# Patient Record
Sex: Male | Born: 1955 | Race: White | Hispanic: No | Marital: Married | Smoking: Never smoker
Health system: Southern US, Community
[De-identification: ages and names within clinical notes are randomized; demographics above are authoritative.]

## PROBLEM LIST (undated history)

## (undated) DIAGNOSIS — K635 Polyp of colon: Secondary | ICD-10-CM

## (undated) DIAGNOSIS — I10 Essential (primary) hypertension: Secondary | ICD-10-CM

## (undated) DIAGNOSIS — E785 Hyperlipidemia, unspecified: Secondary | ICD-10-CM

## (undated) DIAGNOSIS — K5790 Diverticulosis of intestine, part unspecified, without perforation or abscess without bleeding: Secondary | ICD-10-CM

## (undated) DIAGNOSIS — K219 Gastro-esophageal reflux disease without esophagitis: Secondary | ICD-10-CM

## (undated) HISTORY — DX: Polyp of colon: K63.5

## (undated) HISTORY — DX: Essential (primary) hypertension: I10

## (undated) HISTORY — DX: Diverticulosis of intestine, part unspecified, without perforation or abscess without bleeding: K57.90

## (undated) HISTORY — DX: Gastro-esophageal reflux disease without esophagitis: K21.9

## (undated) HISTORY — DX: Hyperlipidemia, unspecified: E78.5

---

## 2005-10-31 ENCOUNTER — Ambulatory Visit: Payer: Self-pay | Admitting: Family Medicine

## 2005-11-07 ENCOUNTER — Ambulatory Visit: Payer: Self-pay | Admitting: Family Medicine

## 2006-11-05 ENCOUNTER — Ambulatory Visit: Payer: Self-pay | Admitting: Family Medicine

## 2006-11-05 LAB — CONVERTED CEMR LAB
ALT: 34 units/L (ref 0–40)
AST: 25 units/L (ref 0–37)
Albumin: 4.2 g/dL (ref 3.5–5.2)
Alkaline Phosphatase: 52 units/L (ref 39–117)
BUN: 11 mg/dL (ref 6–23)
Basophils Absolute: 0 10*3/uL (ref 0.0–0.1)
Basophils Relative: 0.9 % (ref 0.0–1.0)
CO2: 30 meq/L (ref 19–32)
Calcium: 9.4 mg/dL (ref 8.4–10.5)
Chloride: 103 meq/L (ref 96–112)
Cholesterol: 201 mg/dL (ref 0–200)
Creatinine, Ser: 0.9 mg/dL (ref 0.4–1.5)
Direct LDL: 139.6 mg/dL
Eosinophils Absolute: 0.2 10*3/uL (ref 0.0–0.6)
Eosinophils Relative: 3.5 % (ref 0.0–5.0)
GFR calc Af Amer: 115 mL/min
GFR calc non Af Amer: 95 mL/min
Glucose, Bld: 96 mg/dL (ref 70–99)
HCT: 48 % (ref 39.0–52.0)
HDL: 40.2 mg/dL (ref >39.0)
Hemoglobin: 16.5 g/dL (ref 13.0–17.0)
Lymphocytes Relative: 28.6 % (ref 12.0–46.0)
MCHC: 34.5 g/dL (ref 30.0–36.0)
MCV: 89.5 fL (ref 78.0–100.0)
Monocytes Absolute: 0.4 10*3/uL (ref 0.2–0.7)
Monocytes Relative: 7.9 % (ref 3.0–11.0)
Neutro Abs: 3.3 10*3/uL (ref 1.4–7.7)
Neutrophils Relative %: 59.1 % (ref 43.0–77.0)
PSA: 0.81 ng/mL (ref 0.10–4.00)
Platelets: 295 10*3/uL (ref 150–400)
Potassium: 3.9 meq/L (ref 3.5–5.1)
RBC: 5.36 M/uL (ref 4.22–5.81)
RDW: 12.1 % (ref 11.5–14.6)
Sodium: 139 meq/L (ref 135–145)
TSH: 1.78 microintl units/mL (ref 0.35–5.50)
Total Bilirubin: 0.6 mg/dL (ref 0.3–1.2)
Total CHOL/HDL Ratio: 5
Total Protein: 7.4 g/dL (ref 6.0–8.3)
Triglycerides: 194 mg/dL — ABNORMAL HIGH (ref 0–149)
VLDL: 39 mg/dL (ref 0–40)
WBC: 5.5 10*3/uL (ref 4.5–10.5)

## 2006-11-16 ENCOUNTER — Ambulatory Visit: Payer: Self-pay | Admitting: Family Medicine

## 2006-12-06 ENCOUNTER — Ambulatory Visit: Payer: Self-pay | Admitting: Gastroenterology

## 2007-01-09 ENCOUNTER — Encounter (INDEPENDENT_AMBULATORY_CARE_PROVIDER_SITE_OTHER): Payer: Self-pay | Admitting: *Deleted

## 2007-01-09 ENCOUNTER — Ambulatory Visit: Payer: Self-pay | Admitting: Gastroenterology

## 2007-01-09 LAB — HM COLONOSCOPY

## 2007-05-03 ENCOUNTER — Telehealth: Payer: Self-pay | Admitting: *Deleted

## 2007-05-16 ENCOUNTER — Ambulatory Visit: Payer: Self-pay | Admitting: Family Medicine

## 2007-05-17 LAB — CONVERTED CEMR LAB
ALT: 28 units/L (ref 0–53)
AST: 22 units/L (ref 0–37)
Albumin: 4 g/dL (ref 3.5–5.2)
Alkaline Phosphatase: 46 units/L (ref 39–117)
Bilirubin, Direct: 0.1 mg/dL (ref 0.0–0.3)
Cholesterol: 158 mg/dL (ref 0–200)
HDL: 35 mg/dL — ABNORMAL LOW (ref >39.0)
LDL Cholesterol: 94 mg/dL (ref 0–99)
Total Bilirubin: 0.8 mg/dL (ref 0.3–1.2)
Total CHOL/HDL Ratio: 4.5
Total Protein: 6.4 g/dL (ref 6.0–8.3)
Triglycerides: 146 mg/dL (ref 0–149)
VLDL: 29 mg/dL (ref 0–40)

## 2007-11-18 ENCOUNTER — Ambulatory Visit: Payer: Self-pay | Admitting: Family Medicine

## 2007-11-18 LAB — CONVERTED CEMR LAB
Bilirubin Urine: NEGATIVE
Blood in Urine, dipstick: NEGATIVE
Glucose, Urine, Semiquant: NEGATIVE
Ketones, urine, test strip: NEGATIVE
Nitrite: NEGATIVE
Protein, U semiquant: NEGATIVE
Specific Gravity, Urine: 1.025
Urobilinogen, UA: 0.2
WBC Urine, dipstick: NEGATIVE
pH: 7

## 2007-11-20 LAB — CONVERTED CEMR LAB
ALT: 32 units/L (ref 0–53)
AST: 23 units/L (ref 0–37)
Albumin: 4.3 g/dL (ref 3.5–5.2)
Alkaline Phosphatase: 46 units/L (ref 39–117)
BUN: 11 mg/dL (ref 6–23)
Basophils Absolute: 0 10*3/uL (ref 0.0–0.1)
Basophils Relative: 0.5 % (ref 0.0–1.0)
Bilirubin, Direct: 0.2 mg/dL (ref 0.0–0.3)
CO2: 30 meq/L (ref 19–32)
Calcium: 9.5 mg/dL (ref 8.4–10.5)
Chloride: 103 meq/L (ref 96–112)
Cholesterol: 163 mg/dL (ref 0–200)
Creatinine, Ser: 1 mg/dL (ref 0.4–1.5)
Eosinophils Absolute: 0.1 10*3/uL (ref 0.0–0.6)
Eosinophils Relative: 2 % (ref 0.0–5.0)
GFR calc Af Amer: 102 mL/min
GFR calc non Af Amer: 84 mL/min
Glucose, Bld: 97 mg/dL (ref 70–99)
HCT: 48.1 % (ref 39.0–52.0)
HDL: 35.4 mg/dL — ABNORMAL LOW (ref >39.0)
Hemoglobin: 15.9 g/dL (ref 13.0–17.0)
LDL Cholesterol: 96 mg/dL (ref 0–99)
Lymphocytes Relative: 24.4 % (ref 12.0–46.0)
MCHC: 33 g/dL (ref 30.0–36.0)
MCV: 90.8 fL (ref 78.0–100.0)
Monocytes Absolute: 0.5 10*3/uL (ref 0.2–0.7)
Monocytes Relative: 7.5 % (ref 3.0–11.0)
Neutro Abs: 4.2 10*3/uL (ref 1.4–7.7)
Neutrophils Relative %: 65.6 % (ref 43.0–77.0)
PSA: 0.83 ng/mL (ref 0.10–4.00)
Platelets: 267 10*3/uL (ref 150–400)
Potassium: 5 meq/L (ref 3.5–5.1)
RBC: 5.3 M/uL (ref 4.22–5.81)
RDW: 12.1 % (ref 11.5–14.6)
Sodium: 140 meq/L (ref 135–145)
TSH: 1.24 microintl units/mL (ref 0.35–5.50)
Total Bilirubin: 0.9 mg/dL (ref 0.3–1.2)
Total CHOL/HDL Ratio: 4.6
Total Protein: 6.8 g/dL (ref 6.0–8.3)
Triglycerides: 157 mg/dL — ABNORMAL HIGH (ref 0–149)
VLDL: 31 mg/dL (ref 0–40)
WBC: 6.3 10*3/uL (ref 4.5–10.5)

## 2007-11-22 ENCOUNTER — Ambulatory Visit: Payer: Self-pay | Admitting: Family Medicine

## 2007-11-22 DIAGNOSIS — J45909 Unspecified asthma, uncomplicated: Secondary | ICD-10-CM | POA: Insufficient documentation

## 2007-11-22 DIAGNOSIS — Z860100 Personal history of colon polyps, unspecified: Secondary | ICD-10-CM | POA: Insufficient documentation

## 2007-11-22 DIAGNOSIS — K5730 Diverticulosis of large intestine without perforation or abscess without bleeding: Secondary | ICD-10-CM | POA: Insufficient documentation

## 2007-11-22 DIAGNOSIS — E785 Hyperlipidemia, unspecified: Secondary | ICD-10-CM | POA: Insufficient documentation

## 2007-11-22 DIAGNOSIS — I10 Essential (primary) hypertension: Secondary | ICD-10-CM | POA: Insufficient documentation

## 2007-11-22 DIAGNOSIS — K219 Gastro-esophageal reflux disease without esophagitis: Secondary | ICD-10-CM | POA: Insufficient documentation

## 2008-11-27 ENCOUNTER — Ambulatory Visit: Payer: Self-pay | Admitting: Family Medicine

## 2008-11-27 LAB — CONVERTED CEMR LAB
Bilirubin Urine: NEGATIVE
Glucose, Urine, Semiquant: NEGATIVE
Ketones, urine, test strip: NEGATIVE
Nitrite: NEGATIVE
Specific Gravity, Urine: 1.02
Urobilinogen, UA: 0.2
WBC Urine, dipstick: NEGATIVE
pH: 6.5

## 2008-12-01 LAB — CONVERTED CEMR LAB
ALT: 37 units/L (ref 0–53)
AST: 29 units/L (ref 0–37)
Albumin: 4.4 g/dL (ref 3.5–5.2)
Alkaline Phosphatase: 55 units/L (ref 39–117)
BUN: 13 mg/dL (ref 6–23)
Basophils Absolute: 0 10*3/uL (ref 0.0–0.1)
Basophils Relative: 0.3 % (ref 0.0–3.0)
Bilirubin, Direct: 0.1 mg/dL (ref 0.0–0.3)
CO2: 28 meq/L (ref 19–32)
Calcium: 9.4 mg/dL (ref 8.4–10.5)
Chloride: 101 meq/L (ref 96–112)
Cholesterol: 167 mg/dL (ref 0–200)
Creatinine, Ser: 0.9 mg/dL (ref 0.4–1.5)
Eosinophils Absolute: 0.2 10*3/uL (ref 0.0–0.7)
Eosinophils Relative: 3.3 % (ref 0.0–5.0)
GFR calc Af Amer: 114 mL/min
GFR calc non Af Amer: 95 mL/min
Glucose, Bld: 93 mg/dL (ref 70–99)
HCT: 48.6 % (ref 39.0–52.0)
HDL: 41.8 mg/dL (ref >39.0)
Hemoglobin: 16.9 g/dL (ref 13.0–17.0)
LDL Cholesterol: 87 mg/dL (ref 0–99)
Lymphocytes Relative: 26.2 % (ref 12.0–46.0)
MCHC: 34.8 g/dL (ref 30.0–36.0)
MCV: 89.7 fL (ref 78.0–100.0)
Monocytes Absolute: 0.5 10*3/uL (ref 0.1–1.0)
Monocytes Relative: 7.9 % (ref 3.0–12.0)
Neutro Abs: 3.9 10*3/uL (ref 1.4–7.7)
Neutrophils Relative %: 62.3 % (ref 43.0–77.0)
PSA: 0.71 ng/mL (ref 0.10–4.00)
Platelets: 268 10*3/uL (ref 150–400)
Potassium: 3.9 meq/L (ref 3.5–5.1)
RBC: 5.42 M/uL (ref 4.22–5.81)
RDW: 12.4 % (ref 11.5–14.6)
Sodium: 140 meq/L (ref 135–145)
TSH: 1.34 microintl units/mL (ref 0.35–5.50)
Total Bilirubin: 1 mg/dL (ref 0.3–1.2)
Total CHOL/HDL Ratio: 4
Total Protein: 7.3 g/dL (ref 6.0–8.3)
Triglycerides: 192 mg/dL — ABNORMAL HIGH (ref 0–149)
VLDL: 38 mg/dL (ref 0–40)
WBC: 6.2 10*3/uL (ref 4.5–10.5)

## 2008-12-04 ENCOUNTER — Ambulatory Visit: Payer: Self-pay | Admitting: Family Medicine

## 2008-12-04 DIAGNOSIS — F528 Other sexual dysfunction not due to a substance or known physiological condition: Secondary | ICD-10-CM | POA: Insufficient documentation

## 2008-12-31 ENCOUNTER — Encounter: Payer: Self-pay | Admitting: Family Medicine

## 2009-01-11 ENCOUNTER — Telehealth: Payer: Self-pay | Admitting: Family Medicine

## 2009-01-12 ENCOUNTER — Telehealth: Payer: Self-pay | Admitting: Family Medicine

## 2009-01-18 ENCOUNTER — Ambulatory Visit: Payer: Self-pay | Admitting: Family Medicine

## 2009-01-18 DIAGNOSIS — J209 Acute bronchitis, unspecified: Secondary | ICD-10-CM

## 2009-12-23 ENCOUNTER — Ambulatory Visit: Payer: Self-pay | Admitting: Family Medicine

## 2009-12-23 LAB — CONVERTED CEMR LAB
Bilirubin Urine: NEGATIVE
Blood in Urine, dipstick: NEGATIVE
Glucose, Urine, Semiquant: NEGATIVE
Ketones, urine, test strip: NEGATIVE
Nitrite: NEGATIVE
Protein, U semiquant: NEGATIVE
Specific Gravity, Urine: 1.02
Urobilinogen, UA: 0.2
WBC Urine, dipstick: NEGATIVE
pH: 6

## 2009-12-24 LAB — CONVERTED CEMR LAB
ALT: 36 units/L (ref 0–53)
AST: 28 units/L (ref 0–37)
Albumin: 4.4 g/dL (ref 3.5–5.2)
Alkaline Phosphatase: 52 units/L (ref 39–117)
BUN: 12 mg/dL (ref 6–23)
Basophils Absolute: 0 10*3/uL (ref 0.0–0.1)
Basophils Relative: 0.5 % (ref 0.0–3.0)
Bilirubin, Direct: 0 mg/dL (ref 0.0–0.3)
CO2: 31 meq/L (ref 19–32)
Calcium: 9.5 mg/dL (ref 8.4–10.5)
Chloride: 102 meq/L (ref 96–112)
Cholesterol: 157 mg/dL (ref 0–200)
Creatinine, Ser: 1 mg/dL (ref 0.4–1.5)
Eosinophils Absolute: 0.2 10*3/uL (ref 0.0–0.7)
Eosinophils Relative: 3.1 % (ref 0.0–5.0)
GFR calc non Af Amer: 83.33 mL/min (ref >60)
Glucose, Bld: 91 mg/dL (ref 70–99)
HCT: 47.1 % (ref 39.0–52.0)
HDL: 50.3 mg/dL (ref >39.00)
Hemoglobin: 16 g/dL (ref 13.0–17.0)
LDL Cholesterol: 73 mg/dL (ref 0–99)
Lymphocytes Relative: 24.6 % (ref 12.0–46.0)
Lymphs Abs: 1.5 10*3/uL (ref 0.7–4.0)
MCHC: 34 g/dL (ref 30.0–36.0)
MCV: 90.7 fL (ref 78.0–100.0)
Monocytes Absolute: 0.5 10*3/uL (ref 0.1–1.0)
Monocytes Relative: 8.7 % (ref 3.0–12.0)
Neutro Abs: 3.7 10*3/uL (ref 1.4–7.7)
Neutrophils Relative %: 63.1 % (ref 43.0–77.0)
PSA: 0.85 ng/mL (ref 0.10–4.00)
Platelets: 245 10*3/uL (ref 150.0–400.0)
Potassium: 4.2 meq/L (ref 3.5–5.1)
RBC: 5.2 M/uL (ref 4.22–5.81)
RDW: 12.5 % (ref 11.5–14.6)
Sodium: 143 meq/L (ref 135–145)
TSH: 1.63 microintl units/mL (ref 0.35–5.50)
Total Bilirubin: 0.9 mg/dL (ref 0.3–1.2)
Total CHOL/HDL Ratio: 3
Total Protein: 7.8 g/dL (ref 6.0–8.3)
Triglycerides: 170 mg/dL — ABNORMAL HIGH (ref 0.0–149.0)
VLDL: 34 mg/dL (ref 0.0–40.0)
WBC: 5.9 10*3/uL (ref 4.5–10.5)

## 2009-12-29 ENCOUNTER — Ambulatory Visit: Payer: Self-pay | Admitting: Family Medicine

## 2010-10-09 HISTORY — PX: RETINAL DETACHMENT REPAIR W/ SCLERAL BUCKLE LE: SHX2338

## 2010-10-19 ENCOUNTER — Telehealth: Payer: Self-pay | Admitting: Family Medicine

## 2010-11-08 NOTE — Assessment & Plan Note (Signed)
Summary: CPX // RS   Vital Signs:  Patient profile:   55 year old Grant Height:      Marcus.5 inches Weight:      220 pounds BP sitting:   140 / 86  (left arm) Cuff size:   regular  Vitals Entered By: Raechel Ache, RN (December 29, 2009 1:55 PM) CC: CPX, labs done.   History of Present Illness: 55 yr old Grant for cpx. He feels fine and has no concerns.   Allergies (verified): No Known Drug Allergies  Past History:  Past Medical History: Reviewed history from 11/22/2007 and no changes required. Colonic polyps, hx of GERD Diverticulosis, colon Asthma Hypertension Hyperlipidemia  Past Surgical History: Reviewed history from 11/22/2007 and no changes required. Colonoscopy 01/09/07 per Dr. Russella Dar, repeat in 5 yrs  Family History: Reviewed history from 12/04/2008 and no changes required. Family History of CAD Grant 1st degree relative <60 Family History of Colon polyps mother has Alzheimers  Social History: Reviewed history from 11/22/2007 and no changes required. Married Never Smoked Alcohol use-yes  Review of Systems  The patient denies anorexia, fever, weight loss, weight gain, vision loss, decreased hearing, hoarseness, chest pain, syncope, dyspnea on exertion, peripheral edema, prolonged cough, headaches, hemoptysis, abdominal pain, melena, hematochezia, severe indigestion/heartburn, hematuria, incontinence, genital sores, muscle weakness, suspicious skin lesions, transient blindness, difficulty walking, depression, unusual weight change, abnormal bleeding, enlarged lymph nodes, angioedema, breast masses, and testicular masses.    Physical Exam  General:  Well-developed,well-nourished,in no acute distress; alert,appropriate and cooperative throughout examination Head:  Normocephalic and atraumatic without obvious abnormalities. No apparent alopecia or balding. Eyes:  No corneal or conjunctival inflammation noted. EOMI. Perrla. Funduscopic exam benign, without  hemorrhages, exudates or papilledema. Vision grossly normal. Ears:  External ear exam shows no significant lesions or deformities.  Otoscopic examination reveals clear canals, tympanic membranes are intact bilaterally without bulging, retraction, inflammation or discharge. Hearing is grossly normal bilaterally. Nose:  External nasal examination shows no deformity or inflammation. Nasal mucosa are pink and moist without lesions or exudates. Mouth:  Oral mucosa and oropharynx without lesions or exudates.  Teeth in good repair. Neck:  No deformities, masses, or tenderness noted. Chest Wall:  No deformities, masses, tenderness or gynecomastia noted. Lungs:  Normal respiratory effort, chest expands symmetrically. Lungs are clear to auscultation, no crackles or wheezes. Heart:  Normal rate and regular rhythm. S1 and S2 normal without gallop, murmur, click, rub or other extra sounds. EKG normal Abdomen:  Bowel sounds positive,abdomen soft and non-tender without masses, organomegaly or hernias noted. Rectal:  No external abnormalities noted. Normal sphincter tone. No rectal masses or tenderness. Heme neg.  Genitalia:  Testes bilaterally descended without nodularity, tenderness or masses. No scrotal masses or lesions. No penis lesions or urethral discharge. Prostate:  Prostate gland firm and smooth, no enlargement, nodularity, tenderness, mass, asymmetry or induration. Msk:  No deformity or scoliosis noted of thoracic or lumbar spine.   Pulses:  R and L carotid,radial,femoral,dorsalis pedis and posterior tibial pulses are full and equal bilaterally Extremities:  No clubbing, cyanosis, edema, or deformity noted with normal full range of motion of all joints.   Neurologic:  No cranial nerve deficits noted. Station and gait are normal. Plantar reflexes are down-going bilaterally. DTRs are symmetrical throughout. Sensory, motor and coordinative functions appear intact. Skin:  Intact without suspicious lesions or  rashes Cervical Nodes:  No lymphadenopathy noted Axillary Nodes:  No palpable lymphadenopathy Inguinal Nodes:  No significant adenopathy Psych:  Cognition and  judgment appear intact. Alert and cooperative with normal attention span and concentration. No apparent delusions, illusions, hallucinations   Impression & Recommendations:  Problem # 1:  WELL ADULT EXAM (ICD-V70.0)  Orders: EKG w/ Interpretation (93000) Hemoccult Guaiac-1 spec.(in office) (82270)  Complete Medication List: 1)  Fish Oil Oil (Fish oil) .Marland Kitchen.. 1 every other day 2)  Aspirin 81 Mg Tbec (Aspirin) .... One by mouth every day 3)  Simvastatin 20 Mg Tabs (Simvastatin) .... Once daily 4)  Lipo-flavonoid Plus Tabs (Vitamins-lipotropics) .... Once daily 5)  Hydrocodone-homatropine 5-1.5 Mg/45ml Syrp (Hydrocodone-homatropine) .Marland Kitchen.. 1 tsp q 4 hours as needed cough 6)  Ventolin Hfa 108 (90 Base) Mcg/act Aers (Albuterol sulfate) .... 2 puffs q 4 hours as needed 7)  Prevacid 24hr 15 Mg Cpdr (Lansoprazole) .Marland Kitchen.. 1 once daily 8)  Zyrtec Allergy 10 Mg Tabs (Cetirizine hcl) .... As needed  Patient Instructions: 1)  Please schedule a follow-up appointment in 6 months .  2)  It is important that you exercise reguarly at least 20 minutes 5 times a week. If you develop chest pain, have severe difficulty breathing, or feel very tired, stop exercising immediately and seek medical attention.  Prescriptions: VENTOLIN HFA 108 (90 BASE) MCG/ACT AERS (ALBUTEROL SULFATE) 2 puffs q 4 hours as needed  #2 x 3   Entered and Authorized by:   Nelwyn Salisbury MD   Signed by:   Nelwyn Salisbury MD on 12/29/2009   Method used:   Print then Give to Patient   RxID:   [id number] SIMVASTATIN 20 MG  TABS (SIMVASTATIN) once daily  #90 x 3   Entered and Authorized by:   Nelwyn Salisbury MD   Signed by:   Nelwyn Salisbury MD on 12/29/2009   Method used:   Print then Give to Patient   RxID:   [id number]

## 2010-11-10 NOTE — Progress Notes (Signed)
Summary: [facility]   Phone Note Call from Patient Call back at Work Phone [phone]   Caller: Patient Call For: Nelwyn Salisbury MD Summary of Call: Needs to talk to [facility] re: his [facility] prescriptions. [phone] Needs a 14 day supply of Simvastatin [facility]) Initial call taken by: Lynann Beaver CMA AAMA,  October 19, 2010 3:30 PM  Follow-up for Phone Call        called pt left mess   per march notes from dr fry pt needs 6 month fcllow up appt Follow-up by: Pura Spice, RN,  October 19, 2010 3:48 PM  Additional Follow-up for Phone Call Additional follow up Details #1::        pt returned call  needs 14 days simivastatin  informed him of above appt  Additional Follow-up by: Pura Spice, RN,  October 19, 2010 4:46 PM    New/Updated Medications: SIMVASTATIN 20 MG  TABS (SIMVASTATIN) once daily Prescriptions: SIMVASTATIN 20 MG  TABS (SIMVASTATIN) once daily  #30 x 0   Entered by:   Pura Spice, RN   Authorized by:   Nelwyn Salisbury MD   Signed by:   Pura Spice, RN on 10/19/2010   Method used:   [facility]ronically to        [phone][facility] (retail)       [address] [place], [place]  [address]       Ph: [phone]       Fax: [phone]   RxID:   [id number]

## 2011-01-12 ENCOUNTER — Telehealth: Payer: Self-pay | Admitting: *Deleted

## 2011-01-12 MED ORDER — SCOPOLAMINE 1 MG/3DAYS TD PT72: MEDICATED_PATCH | TRANSDERMAL | Status: DC

## 2011-01-12 NOTE — Telephone Encounter (Signed)
Call in Scopolamine patches, to apply one patch behind an ear every 3 days, #10 with no rf

## 2011-01-12 NOTE — Telephone Encounter (Signed)
rx for scopolamine patches called [facility]  Pt aware.

## 2011-01-12 NOTE — Telephone Encounter (Signed)
Pt would like scopalamine patches for a cruise .  Has labs scheduled prior to cruise.  [facility] [place]. Please contact patient with answer.

## 2011-02-09 ENCOUNTER — Other Ambulatory Visit (INDEPENDENT_AMBULATORY_CARE_PROVIDER_SITE_OTHER): Payer: 59 | Admitting: Family Medicine

## 2011-02-09 DIAGNOSIS — Z0000 Encounter for general adult medical examination without abnormal findings: Secondary | ICD-10-CM

## 2011-02-09 LAB — POCT URINALYSIS DIPSTICK
Bilirubin, UA: NEGATIVE
Blood, UA: NEGATIVE
Glucose, UA: NEGATIVE
Ketones, UA: NEGATIVE
Leukocytes, UA: NEGATIVE
Nitrite, UA: NEGATIVE
Protein, UA: NEGATIVE
Spec Grav, UA: 1.02
Urobilinogen, UA: 0.2
pH, UA: 7

## 2011-02-09 LAB — CBC WITH DIFFERENTIAL/PLATELET
Basophils Absolute: 0 10*3/uL (ref 0.0–0.1)
Basophils Relative: 0.3 % (ref 0.0–3.0)
Eosinophils Absolute: 0.3 10*3/uL (ref 0.0–0.7)
Eosinophils Relative: 5.5 % — ABNORMAL HIGH (ref 0.0–5.0)
HCT: 47.3 % (ref 39.0–52.0)
Hemoglobin: 16.1 g/dL (ref 13.0–17.0)
Lymphocytes Relative: 33.5 % (ref 12.0–46.0)
Lymphs Abs: 1.9 10*3/uL (ref 0.7–4.0)
MCHC: 34 g/dL (ref 30.0–36.0)
MCV: 90.8 fl (ref 78.0–100.0)
Monocytes Absolute: 0.5 10*3/uL (ref 0.1–1.0)
Monocytes Relative: 9 % (ref 3.0–12.0)
Neutro Abs: 2.9 10*3/uL (ref 1.4–7.7)
Neutrophils Relative %: 51.7 % (ref 43.0–77.0)
Platelets: 269 10*3/uL (ref 150.0–400.0)
RBC: 5.21 Mil/uL (ref 4.22–5.81)
RDW: 13.7 % (ref 11.5–14.6)
WBC: 5.5 10*3/uL (ref 4.5–10.5)

## 2011-02-09 LAB — PSA: PSA: 0.85 ng/mL (ref 0.10–4.00)

## 2011-02-09 LAB — HEPATIC FUNCTION PANEL
ALT: 35 U/L (ref 0–53)
AST: 27 U/L (ref 0–37)
Albumin: 4.4 g/dL (ref 3.5–5.2)
Alkaline Phosphatase: 51 U/L (ref 39–117)
Bilirubin, Direct: 0.1 mg/dL (ref 0.0–0.3)
Total Bilirubin: 1 mg/dL (ref 0.3–1.2)
Total Protein: 7.3 g/dL (ref 6.0–8.3)

## 2011-02-09 LAB — BASIC METABOLIC PANEL
BUN: 15 mg/dL (ref 6–23)
CO2: 28 mEq/L (ref 19–32)
Calcium: 9.7 mg/dL (ref 8.4–10.5)
Chloride: 103 mEq/L (ref 96–112)
Creatinine, Ser: 1 mg/dL (ref 0.4–1.5)
GFR: 84.92 mL/min (ref >60.00)
Glucose, Bld: 94 mg/dL (ref 70–99)
Potassium: 5.3 mEq/L — ABNORMAL HIGH (ref 3.5–5.1)
Sodium: 141 mEq/L (ref 135–145)

## 2011-02-09 LAB — LIPID PANEL
Cholesterol: 172 mg/dL (ref 0–200)
HDL: 44.3 mg/dL (ref >39.00)
LDL Cholesterol: 97 mg/dL (ref 0–99)
Total CHOL/HDL Ratio: 4
Triglycerides: 156 mg/dL — ABNORMAL HIGH (ref 0.0–149.0)
VLDL: 31.2 mg/dL (ref 0.0–40.0)

## 2011-02-09 LAB — TSH: TSH: 1.68 u[IU]/mL (ref 0.35–5.50)

## 2011-02-13 ENCOUNTER — Telehealth: Payer: Self-pay | Admitting: *Deleted

## 2011-02-13 NOTE — Telephone Encounter (Signed)
LM w/ wife.

## 2011-02-24 NOTE — Assessment & Plan Note (Signed)
[facility] OFFICE NOTE   NAME:Brosnahan, SERJIO DEUPREE                  MRN:          [id number]  DATE:11/16/2006                            DOB:          [birth year]    This is a 55 year old gentleman here for a complete physical  examination. Generally feels good and has no complaints. His asthma has  been doing quite well, and he has not used an albuterol inhaler in over  a year. His acid reflux is controlled well with diet and over-the-  counter agents. We have been following him for elevated lipids in the  past, but have thus far tried to avoid medications. It sounds like he is  watching a fairly healthy diet when I talked to him today. He is due for  another colonoscopy this year since he has had a history of benign  polyps.   For further details of his Past Medical History, Family History, Social  History, habits, etc., see last physical note dated November 07, 2005.   ALLERGIES:  None.   CURRENT MEDICATIONS:  1. Prilosec OTC once daily.  2. Multivitamins daily.  3. Aspirin 81 mg daily.   OBJECTIVE:  VITAL SIGNS:  Height 5 feet 8 inches, weight 203, BP 108/70,  pulse 68 and regular.  GENERAL:  Appears to be healthy.  SKIN:  Clear.  HEENT:  Eyes:  Clear. Oropharynx:  Clear.  NECK:  Supple without lymphadenopathy, masses.  LUNGS:  Clear.  CARDIAC:  Rate and rhythm regular without gallops, murmurs or rubs.  Pulses are full.  ABDOMEN:  Soft, normal bowel sounds, nontender masses.  GENITALIA:  Normal male.  RECTAL:  No masses or tenderness, prostate is within normal limits.  Stool Hemoccult negative.  EXTREMITIES:  No clubbing, cyanosis or edema.  NEUROLOGIC:  Grossly intact.   He was here for fasting labs on January 28. These were all within normal  limits except for his lipid panel. Triglycerides were high at 194. HDL  was adequate at 40, and LDL was high at 140.   ASSESSMENT/PLAN:  1. Complete  physical exam. I encouraged him to get more regular      exercise.  2. History of colon polyps. Will set him up to get another colonoscopy      this year.  3. Hyperlipidemia. Will begin treatment with simvastatin 20 mg once      daily. Along with his diet I      asked him to come back for another lipid panel in three months.  4. Asthma doing well off of all medications.  5. Gastroesophageal reflux disease, stable.     Tera Mater. Clent Ridges, MD  Electronically Signed    SAF/MedQ  DD: 11/19/2006  DT: 11/19/2006  Job #: [id number]

## 2011-03-07 ENCOUNTER — Telehealth: Payer: Self-pay | Admitting: *Deleted

## 2011-03-07 NOTE — Telephone Encounter (Signed)
Pt unable to keep cpx appt scheduled for tomorrow due to staffing issues at his job.  Requesting to be worked in for cpx with the next 2 wks, preferable in the afternoon.  Please advise if and where pt can be worked in for cpx.

## 2011-03-08 ENCOUNTER — Encounter: Payer: Self-pay | Admitting: Family Medicine

## 2011-03-08 NOTE — Telephone Encounter (Signed)
Okay to work him in on any afternoon except Friday

## 2011-03-09 NOTE — Telephone Encounter (Signed)
appt scheduled with patient.

## 2011-03-11 ENCOUNTER — Ambulatory Visit: Payer: Self-pay | Admitting: Ophthalmology

## 2011-03-12 NOTE — Progress Notes (Signed)
  Subjective:    Patient ID: Marcus Grant, male    DOB: [birth year], 55 y.o.   MRN: [id number]  HPI    Review of Systems     Objective:   Physical Exam        Assessment & Plan:   This encounter was created in error - please disregard.

## 2011-03-24 ENCOUNTER — Other Ambulatory Visit: Payer: Self-pay | Admitting: Family Medicine

## 2011-03-29 ENCOUNTER — Ambulatory Visit (INDEPENDENT_AMBULATORY_CARE_PROVIDER_SITE_OTHER): Payer: 59 | Admitting: Family Medicine

## 2011-03-29 ENCOUNTER — Encounter: Payer: Self-pay | Admitting: Family Medicine

## 2011-03-29 VITALS — BP 132/84 | HR 68 | Temp 98.6°F | Ht 69.0 in | Wt 216.0 lb

## 2011-03-29 DIAGNOSIS — Z0000 Encounter for general adult medical examination without abnormal findings: Secondary | ICD-10-CM

## 2011-03-29 MED ORDER — ALBUTEROL SULFATE HFA 108 (90 BASE) MCG/ACT IN AERS: 2.0000 | INHALATION_SPRAY | Freq: Four times a day (QID) | RESPIRATORY_TRACT | Status: DC | PRN

## 2011-03-29 MED ORDER — SIMVASTATIN 20 MG PO TABS: 20.0000 mg | ORAL_TABLET | Freq: Every day | ORAL | Status: DC

## 2011-03-29 NOTE — Progress Notes (Signed)
  Subjective:    Patient ID: Marcus Grant, male    DOB: 01-27-1956, 55 y.o.   MRN: [id number]  HPI 55 yr old male for a cpx. He feels well with no complaints. He had retinal surgery 2 months ago and his vision is improving, but he has some trouble in the left eye.    Review of Systems  Constitutional: Negative.   HENT: Negative.   Eyes: Negative.   Respiratory: Negative.   Cardiovascular: Negative.   Gastrointestinal: Negative.   Genitourinary: Negative.   Musculoskeletal: Negative.   Skin: Negative.   Neurological: Negative.   Hematological: Negative.   Psychiatric/Behavioral: Negative.        Objective:   Physical Exam  Constitutional: He is oriented to person, place, and time. He appears well-developed and well-nourished. No distress.  HENT:  Head: Normocephalic and atraumatic.  Right Ear: External ear normal.  Left Ear: External ear normal.  Nose: Nose normal.  Mouth/Throat: Oropharynx is clear and moist. No oropharyngeal exudate.  Eyes: Conjunctivae and EOM are normal. Pupils are equal, round, and reactive to light. Right eye exhibits no discharge. Left eye exhibits no discharge. No scleral icterus.  Neck: Neck supple. No JVD present. No tracheal deviation present. No thyromegaly present.  Cardiovascular: Normal rate, regular rhythm, normal heart sounds and intact distal pulses.  Exam reveals no gallop and no friction rub.   No murmur heard.      EKG normal   Pulmonary/Chest: Effort normal and breath sounds normal. No respiratory distress. He has no wheezes. He has no rales. He exhibits no tenderness.  Abdominal: Soft. Bowel sounds are normal. He exhibits no distension and no mass. There is no tenderness. There is no rebound and no guarding.  Genitourinary: Rectum normal, prostate normal and penis normal. Guaiac negative stool. No penile tenderness.  Musculoskeletal: Normal range of motion. He exhibits no edema and no tenderness.  Lymphadenopathy:    He has no  cervical adenopathy.  Neurological: He is alert and oriented to person, place, and time. He has normal reflexes. No cranial nerve deficit. He exhibits normal muscle tone. Coordination normal.  Skin: Skin is warm and dry. No rash noted. He is not diaphoretic. No erythema. No pallor.  Psychiatric: He has a normal mood and affect. His behavior is normal. Judgment and thought content normal.          Assessment & Plan:  Doing well. He needs to exercise more

## 2012-05-01 ENCOUNTER — Other Ambulatory Visit: Payer: Self-pay | Admitting: Family Medicine

## 2012-06-12 ENCOUNTER — Other Ambulatory Visit (INDEPENDENT_AMBULATORY_CARE_PROVIDER_SITE_OTHER): Payer: 59

## 2012-06-12 DIAGNOSIS — Z0000 Encounter for general adult medical examination without abnormal findings: Secondary | ICD-10-CM

## 2012-06-12 LAB — HEPATIC FUNCTION PANEL
ALT: 30 U/L (ref 0–53)
AST: 24 U/L (ref 0–37)
Albumin: 4.3 g/dL (ref 3.5–5.2)
Alkaline Phosphatase: 50 U/L (ref 39–117)
Bilirubin, Direct: 0.2 mg/dL (ref 0.0–0.3)
Total Bilirubin: 0.8 mg/dL (ref 0.3–1.2)
Total Protein: 7.1 g/dL (ref 6.0–8.3)

## 2012-06-12 LAB — POCT URINALYSIS DIPSTICK
Bilirubin, UA: NEGATIVE
Blood, UA: NEGATIVE
Glucose, UA: NEGATIVE
Ketones, UA: NEGATIVE
Leukocytes, UA: NEGATIVE
Nitrite, UA: NEGATIVE
Protein, UA: NEGATIVE
Spec Grav, UA: 1.02
Urobilinogen, UA: 0.2
pH, UA: 7

## 2012-06-12 LAB — CBC WITH DIFFERENTIAL/PLATELET
Basophils Absolute: 0 10*3/uL (ref 0.0–0.1)
Basophils Relative: 0.3 % (ref 0.0–3.0)
Eosinophils Absolute: 0.2 10*3/uL (ref 0.0–0.7)
Eosinophils Relative: 3.4 % (ref 0.0–5.0)
HCT: 47.3 % (ref 39.0–52.0)
Hemoglobin: 15.7 g/dL (ref 13.0–17.0)
Lymphocytes Relative: 31.5 % (ref 12.0–46.0)
Lymphs Abs: 1.8 10*3/uL (ref 0.7–4.0)
MCHC: 33.2 g/dL (ref 30.0–36.0)
MCV: 91.4 fl (ref 78.0–100.0)
Monocytes Absolute: 0.5 10*3/uL (ref 0.1–1.0)
Monocytes Relative: 9.4 % (ref 3.0–12.0)
Neutro Abs: 3.2 10*3/uL (ref 1.4–7.7)
Neutrophils Relative %: 55.4 % (ref 43.0–77.0)
Platelets: 258 10*3/uL (ref 150.0–400.0)
RBC: 5.17 Mil/uL (ref 4.22–5.81)
RDW: 13.5 % (ref 11.5–14.6)
WBC: 5.8 10*3/uL (ref 4.5–10.5)

## 2012-06-12 LAB — BASIC METABOLIC PANEL
BUN: 15 mg/dL (ref 6–23)
CO2: 26 mEq/L (ref 19–32)
Calcium: 9.5 mg/dL (ref 8.4–10.5)
Chloride: 101 mEq/L (ref 96–112)
Creatinine, Ser: 1 mg/dL (ref 0.4–1.5)
GFR: 85.5 mL/min (ref >60.00)
Glucose, Bld: 89 mg/dL (ref 70–99)
Potassium: 4.7 mEq/L (ref 3.5–5.1)
Sodium: 135 mEq/L (ref 135–145)

## 2012-06-12 LAB — LIPID PANEL
Cholesterol: 169 mg/dL (ref 0–200)
HDL: 44.3 mg/dL (ref >39.00)
LDL Cholesterol: 94 mg/dL (ref 0–99)
Total CHOL/HDL Ratio: 4
Triglycerides: 153 mg/dL — ABNORMAL HIGH (ref 0.0–149.0)
VLDL: 30.6 mg/dL (ref 0.0–40.0)

## 2012-06-12 LAB — PSA: PSA: 1.21 ng/mL (ref 0.10–4.00)

## 2012-06-12 LAB — TSH: TSH: 1.9 u[IU]/mL (ref 0.35–5.50)

## 2012-06-13 NOTE — Progress Notes (Signed)
Quick Note:  I left voice message with results. ______ 

## 2012-06-21 ENCOUNTER — Encounter: Payer: 59 | Admitting: Family Medicine

## 2012-07-16 ENCOUNTER — Encounter: Payer: Self-pay | Admitting: Family Medicine

## 2012-07-16 ENCOUNTER — Ambulatory Visit (INDEPENDENT_AMBULATORY_CARE_PROVIDER_SITE_OTHER): Payer: 59 | Admitting: Family Medicine

## 2012-07-16 VITALS — BP 132/84 | HR 87 | Temp 98.6°F | Ht 69.0 in | Wt 213.0 lb

## 2012-07-16 DIAGNOSIS — Z0000 Encounter for general adult medical examination without abnormal findings: Secondary | ICD-10-CM

## 2012-07-16 DIAGNOSIS — Z23 Encounter for immunization: Secondary | ICD-10-CM

## 2012-07-16 MED ORDER — TERBINAFINE HCL 250 MG PO TABS: 250.0000 mg | ORAL_TABLET | Freq: Every day | ORAL | Status: DC

## 2012-07-16 MED ORDER — SIMVASTATIN 20 MG PO TABS: 20.0000 mg | ORAL_TABLET | Freq: Every day | ORAL | Status: DC

## 2012-07-16 MED ORDER — ALBUTEROL SULFATE HFA 108 (90 BASE) MCG/ACT IN AERS: 2.0000 | INHALATION_SPRAY | RESPIRATORY_TRACT | Status: DC | PRN

## 2012-07-16 MED ORDER — LANSOPRAZOLE 30 MG PO CPDR: 30.0000 mg | DELAYED_RELEASE_CAPSULE | Freq: Every day | ORAL | Status: DC

## 2012-07-16 NOTE — Progress Notes (Signed)
  Subjective:    Patient ID: Marcus Grant, male    DOB: 1956-01-22, 56 y.o.   MRN: [id number]  HPI 56 yr old male for a cpx. He feels well.    Review of Systems  Constitutional: Negative.   HENT: Negative.   Eyes: Negative.   Respiratory: Negative.   Cardiovascular: Negative.   Gastrointestinal: Negative.   Genitourinary: Negative.   Musculoskeletal: Negative.   Skin: Negative.   Neurological: Negative.   Hematological: Negative.   Psychiatric/Behavioral: Negative.        Objective:   Physical Exam  Constitutional: He is oriented to person, place, and time. He appears well-developed and well-nourished. No distress.  HENT:  Head: Normocephalic and atraumatic.  Right Ear: External ear normal.  Left Ear: External ear normal.  Nose: Nose normal.  Mouth/Throat: Oropharynx is clear and moist. No oropharyngeal exudate.  Eyes: Conjunctivae normal and EOM are normal. Pupils are equal, round, and reactive to light. Right eye exhibits no discharge. Left eye exhibits no discharge. No scleral icterus.  Neck: Neck supple. No JVD present. No tracheal deviation present. No thyromegaly present.  Cardiovascular: Normal rate, regular rhythm, normal heart sounds and intact distal pulses.  Exam reveals no gallop and no friction rub.   No murmur heard.      EKG normal   Pulmonary/Chest: Effort normal and breath sounds normal. No respiratory distress. He has no wheezes. He has no rales. He exhibits no tenderness.  Abdominal: Soft. Bowel sounds are normal. He exhibits no distension and no mass. There is no tenderness. There is no rebound and no guarding.  Genitourinary: Rectum normal, prostate normal and penis normal. Guaiac negative stool. No penile tenderness.  Musculoskeletal: Normal range of motion. He exhibits no edema and no tenderness.  Lymphadenopathy:    He has no cervical adenopathy.  Neurological: He is alert and oriented to person, place, and time. He has normal reflexes. No  cranial nerve deficit. He exhibits normal muscle tone. Coordination normal.  Skin: Skin is warm and dry. No rash noted. He is not diaphoretic. No erythema. No pallor.  Psychiatric: He has a normal mood and affect. His behavior is normal. Judgment and thought content normal.          Assessment & Plan:  Well exam

## 2012-08-20 ENCOUNTER — Telehealth: Payer: Self-pay | Admitting: Family Medicine

## 2012-08-20 NOTE — Telephone Encounter (Signed)
Pt would like attachment that goes on the end of albuterol inhaler. Best number for him [phone]

## 2012-08-21 NOTE — Telephone Encounter (Signed)
Per Dr. Clent Ridges, okay to call in aero chamber spacer with 3 refills. [facility]

## 2013-05-26 ENCOUNTER — Telehealth: Payer: Self-pay | Admitting: Family Medicine

## 2013-05-26 NOTE — Telephone Encounter (Signed)
Pt insurance is no longer covering his lansoprazole (PREVACID) 30 MG capsule. They have given him 3 different generic medications to chose from; "raprozole", "pantaprazole", and "omeprazole". He would like a 3 month supply of whichever one Dr. Clent Ridges chooses, call into [facility]. Please assist.

## 2013-05-26 NOTE — Telephone Encounter (Signed)
Send in Omeprazole 40 mg daily, #90 with 3 rf 

## 2013-05-27 MED ORDER — OMEPRAZOLE 40 MG PO CPDR: 40.0000 mg | DELAYED_RELEASE_CAPSULE | Freq: Every day | ORAL | Status: DC

## 2013-05-27 NOTE — Telephone Encounter (Signed)
New Rx sent.

## 2013-07-10 ENCOUNTER — Other Ambulatory Visit (INDEPENDENT_AMBULATORY_CARE_PROVIDER_SITE_OTHER): Payer: 59

## 2013-07-10 DIAGNOSIS — Z0000 Encounter for general adult medical examination without abnormal findings: Secondary | ICD-10-CM

## 2013-07-10 LAB — POCT URINALYSIS DIPSTICK
Bilirubin, UA: NEGATIVE
Glucose, UA: NEGATIVE
Ketones, UA: NEGATIVE
Leukocytes, UA: NEGATIVE
Nitrite, UA: NEGATIVE
Protein, UA: NEGATIVE
Spec Grav, UA: 1.02
Urobilinogen, UA: 0.2
pH, UA: 7

## 2013-07-10 LAB — CBC WITH DIFFERENTIAL/PLATELET
Basophils Absolute: 0 10*3/uL (ref 0.0–0.1)
Basophils Relative: 0.4 % (ref 0.0–3.0)
Eosinophils Absolute: 0.3 10*3/uL (ref 0.0–0.7)
Eosinophils Relative: 4.6 % (ref 0.0–5.0)
HCT: 47.4 % (ref 39.0–52.0)
Hemoglobin: 16.3 g/dL (ref 13.0–17.0)
Lymphocytes Relative: 29.6 % (ref 12.0–46.0)
Lymphs Abs: 1.8 10*3/uL (ref 0.7–4.0)
MCHC: 34.3 g/dL (ref 30.0–36.0)
MCV: 88.5 fl (ref 78.0–100.0)
Monocytes Absolute: 0.5 10*3/uL (ref 0.1–1.0)
Monocytes Relative: 8.8 % (ref 3.0–12.0)
Neutro Abs: 3.4 10*3/uL (ref 1.4–7.7)
Neutrophils Relative %: 56.6 % (ref 43.0–77.0)
Platelets: 276 10*3/uL (ref 150.0–400.0)
RBC: 5.35 Mil/uL (ref 4.22–5.81)
RDW: 13.2 % (ref 11.5–14.6)
WBC: 6.1 10*3/uL (ref 4.5–10.5)

## 2013-07-11 LAB — HEPATIC FUNCTION PANEL
ALT: 25 U/L (ref 0–53)
AST: 21 U/L (ref 0–37)
Albumin: 4.4 g/dL (ref 3.5–5.2)
Alkaline Phosphatase: 51 U/L (ref 39–117)
Bilirubin, Direct: 0.1 mg/dL (ref 0.0–0.3)
Total Bilirubin: 0.9 mg/dL (ref 0.3–1.2)
Total Protein: 7 g/dL (ref 6.0–8.3)

## 2013-07-11 LAB — BASIC METABOLIC PANEL
BUN: 12 mg/dL (ref 6–23)
CO2: 29 mEq/L (ref 19–32)
Calcium: 9.6 mg/dL (ref 8.4–10.5)
Chloride: 103 mEq/L (ref 96–112)
Creatinine, Ser: 0.9 mg/dL (ref 0.4–1.5)
GFR: 96.56 mL/min (ref >60.00)
Glucose, Bld: 88 mg/dL (ref 70–99)
Potassium: 4.7 mEq/L (ref 3.5–5.1)
Sodium: 139 mEq/L (ref 135–145)

## 2013-07-11 LAB — LIPID PANEL
Cholesterol: 160 mg/dL (ref 0–200)
HDL: 42.6 mg/dL (ref >39.00)
LDL Cholesterol: 83 mg/dL (ref 0–99)
Total CHOL/HDL Ratio: 4
Triglycerides: 174 mg/dL — ABNORMAL HIGH (ref 0.0–149.0)
VLDL: 34.8 mg/dL (ref 0.0–40.0)

## 2013-07-11 LAB — PSA: PSA: 1.01 ng/mL (ref 0.10–4.00)

## 2013-07-11 LAB — TSH: TSH: 1.33 u[IU]/mL (ref 0.35–5.50)

## 2013-07-11 NOTE — Progress Notes (Signed)
Quick Note:  Pt has appointment on 07/17/13 will go over then. ______ 

## 2013-07-17 ENCOUNTER — Encounter: Payer: Self-pay | Admitting: Family Medicine

## 2013-07-17 ENCOUNTER — Ambulatory Visit (INDEPENDENT_AMBULATORY_CARE_PROVIDER_SITE_OTHER): Payer: 59 | Admitting: Family Medicine

## 2013-07-17 VITALS — BP 130/80 | HR 96 | Temp 98.4°F | Ht 69.0 in | Wt 210.0 lb

## 2013-07-17 DIAGNOSIS — Z0000 Encounter for general adult medical examination without abnormal findings: Secondary | ICD-10-CM

## 2013-07-17 MED ORDER — OMEPRAZOLE 40 MG PO CPDR: 40.0000 mg | DELAYED_RELEASE_CAPSULE | Freq: Every day | ORAL | Status: DC

## 2013-07-17 MED ORDER — SIMVASTATIN 20 MG PO TABS: 20.0000 mg | ORAL_TABLET | Freq: Every day | ORAL | Status: DC

## 2013-07-17 MED ORDER — ALBUTEROL SULFATE HFA 108 (90 BASE) MCG/ACT IN AERS: 2.0000 | INHALATION_SPRAY | RESPIRATORY_TRACT | Status: DC | PRN

## 2013-07-17 NOTE — Progress Notes (Signed)
  Subjective:    Patient ID: Marcus Grant, male    DOB: 02/07/1956, 57 y.o.   MRN: [id number]  HPI 57 yr old male for a cpx. He feels fine.    Review of Systems  Constitutional: Negative.   HENT: Negative.   Eyes: Negative.   Respiratory: Negative.   Cardiovascular: Negative.   Gastrointestinal: Negative.   Genitourinary: Negative.   Musculoskeletal: Negative.   Skin: Negative.   Neurological: Negative.   Psychiatric/Behavioral: Negative.        Objective:   Physical Exam  Constitutional: He is oriented to person, place, and time. He appears well-developed and well-nourished. No distress.  HENT:  Head: Normocephalic and atraumatic.  Right Ear: External ear normal.  Left Ear: External ear normal.  Nose: Nose normal.  Mouth/Throat: Oropharynx is clear and moist. No oropharyngeal exudate.  Eyes: Conjunctivae and EOM are normal. Pupils are equal, round, and reactive to light. Right eye exhibits no discharge. Left eye exhibits no discharge. No scleral icterus.  Neck: Neck supple. No JVD present. No tracheal deviation present. No thyromegaly present.  Cardiovascular: Normal rate, regular rhythm, normal heart sounds and intact distal pulses.  Exam reveals no gallop and no friction rub.   No murmur heard. EKG normal   Pulmonary/Chest: Effort normal and breath sounds normal. No respiratory distress. He has no wheezes. He has no rales. He exhibits no tenderness.  Abdominal: Soft. Bowel sounds are normal. He exhibits no distension and no mass. There is no tenderness. There is no rebound and no guarding.  Genitourinary: Rectum normal, prostate normal and penis normal. Guaiac negative stool. No penile tenderness.  Musculoskeletal: Normal range of motion. He exhibits no edema and no tenderness.  Lymphadenopathy:    He has no cervical adenopathy.  Neurological: He is alert and oriented to person, place, and time. He has normal reflexes. No cranial nerve deficit. He exhibits normal  muscle tone. Coordination normal.  Skin: Skin is warm and dry. No rash noted. He is not diaphoretic. No erythema. No pallor.  Psychiatric: He has a normal mood and affect. His behavior is normal. Judgment and thought content normal.          Assessment & Plan:  Well exam.

## 2014-01-27 ENCOUNTER — Telehealth: Payer: Self-pay | Admitting: Family Medicine

## 2014-01-27 NOTE — Telephone Encounter (Signed)
Switch to Pantoprazole 40 mg daily, call in #90 with 3 rf

## 2014-01-27 NOTE — Telephone Encounter (Signed)
Pt states his insurance will not cover omeprazole (PRILOSEC) 40 MG capsule  but will now cover pantoprazole. Pt would like 90 day w/ [facility] [address] Pt needs new rx due to new med. Ok to leave message

## 2014-01-28 MED ORDER — PANTOPRAZOLE SODIUM 40 MG PO TBEC: 40.0000 mg | DELAYED_RELEASE_TABLET | Freq: Every day | ORAL | Status: DC

## 2014-01-28 NOTE — Telephone Encounter (Signed)
I sent script e-scribe and left a voice message for pt. 

## 2014-03-23 ENCOUNTER — Telehealth: Payer: Self-pay | Admitting: Family Medicine

## 2014-03-23 NOTE — Telephone Encounter (Signed)
Pt no longer getting rx from mail order need new rx sent to [place]

## 2014-03-24 NOTE — Telephone Encounter (Signed)
Medicine is simvastatin (ZOCOR) 20 MG tablet

## 2014-03-24 NOTE — Telephone Encounter (Signed)
What medication is needed?

## 2014-03-30 MED ORDER — SIMVASTATIN 20 MG PO TABS: 20.0000 mg | ORAL_TABLET | Freq: Every day | ORAL | Status: DC

## 2014-03-30 NOTE — Telephone Encounter (Signed)
I sent script e-scribe and left message for pt.

## 2014-06-12 ENCOUNTER — Other Ambulatory Visit: Payer: Self-pay | Admitting: Family Medicine

## 2014-07-23 ENCOUNTER — Other Ambulatory Visit (INDEPENDENT_AMBULATORY_CARE_PROVIDER_SITE_OTHER): Payer: 59

## 2014-07-23 DIAGNOSIS — Z0000 Encounter for general adult medical examination without abnormal findings: Secondary | ICD-10-CM

## 2014-07-23 LAB — HEPATIC FUNCTION PANEL
ALT: 30 U/L (ref 0–53)
AST: 22 U/L (ref 0–37)
Albumin: 3.9 g/dL (ref 3.5–5.2)
Alkaline Phosphatase: 55 U/L (ref 39–117)
Bilirubin, Direct: 0.2 mg/dL (ref 0.0–0.3)
Total Bilirubin: 1.1 mg/dL (ref 0.2–1.2)
Total Protein: 7.5 g/dL (ref 6.0–8.3)

## 2014-07-23 LAB — CBC WITH DIFFERENTIAL/PLATELET
Basophils Absolute: 0 10*3/uL (ref 0.0–0.1)
Basophils Relative: 0.6 % (ref 0.0–3.0)
Eosinophils Absolute: 0.3 10*3/uL (ref 0.0–0.7)
Eosinophils Relative: 4.4 % (ref 0.0–5.0)
HCT: 49.1 % (ref 39.0–52.0)
Hemoglobin: 16 g/dL (ref 13.0–17.0)
Lymphocytes Relative: 28.2 % (ref 12.0–46.0)
Lymphs Abs: 1.8 10*3/uL (ref 0.7–4.0)
MCHC: 32.7 g/dL (ref 30.0–36.0)
MCV: 90.6 fl (ref 78.0–100.0)
Monocytes Absolute: 0.5 10*3/uL (ref 0.1–1.0)
Monocytes Relative: 8 % (ref 3.0–12.0)
Neutro Abs: 3.8 10*3/uL (ref 1.4–7.7)
Neutrophils Relative %: 58.8 % (ref 43.0–77.0)
Platelets: 280 10*3/uL (ref 150.0–400.0)
RBC: 5.42 Mil/uL (ref 4.22–5.81)
RDW: 13.4 % (ref 11.5–15.5)
WBC: 6.4 10*3/uL (ref 4.0–10.5)

## 2014-07-23 LAB — LIPID PANEL
Cholesterol: 151 mg/dL (ref 0–200)
HDL: 36.9 mg/dL — ABNORMAL LOW (ref >39.00)
LDL Cholesterol: 87 mg/dL (ref 0–99)
NonHDL: 114.1
Total CHOL/HDL Ratio: 4
Triglycerides: 138 mg/dL (ref 0.0–149.0)
VLDL: 27.6 mg/dL (ref 0.0–40.0)

## 2014-07-23 LAB — BASIC METABOLIC PANEL
BUN: 14 mg/dL (ref 6–23)
CO2: 27 mEq/L (ref 19–32)
Calcium: 9.6 mg/dL (ref 8.4–10.5)
Chloride: 103 mEq/L (ref 96–112)
Creatinine, Ser: 0.9 mg/dL (ref 0.4–1.5)
GFR: 92.51 mL/min (ref >60.00)
Glucose, Bld: 92 mg/dL (ref 70–99)
Potassium: 4.4 mEq/L (ref 3.5–5.1)
Sodium: 139 mEq/L (ref 135–145)

## 2014-07-23 LAB — POCT URINALYSIS DIPSTICK
Bilirubin, UA: NEGATIVE
Blood, UA: NEGATIVE
Glucose, UA: NEGATIVE
Ketones, UA: NEGATIVE
Leukocytes, UA: NEGATIVE
Nitrite, UA: NEGATIVE
Protein, UA: NEGATIVE
Spec Grav, UA: 1.015
Urobilinogen, UA: 0.2
pH, UA: 7

## 2014-07-23 LAB — TSH: TSH: 1.5 u[IU]/mL (ref 0.35–4.50)

## 2014-07-23 LAB — PSA: PSA: 1.06 ng/mL (ref 0.10–4.00)

## 2014-07-29 ENCOUNTER — Ambulatory Visit (INDEPENDENT_AMBULATORY_CARE_PROVIDER_SITE_OTHER): Payer: 59 | Admitting: Family Medicine

## 2014-07-29 ENCOUNTER — Encounter: Payer: Self-pay | Admitting: Family Medicine

## 2014-07-29 VITALS — BP 163/90 | HR 107 | Temp 98.9°F | Ht 68.5 in | Wt 213.0 lb

## 2014-07-29 DIAGNOSIS — Z0000 Encounter for general adult medical examination without abnormal findings: Secondary | ICD-10-CM

## 2014-07-29 MED ORDER — PANTOPRAZOLE SODIUM 40 MG PO TBEC: 40.0000 mg | DELAYED_RELEASE_TABLET | Freq: Every day | ORAL | Status: DC

## 2014-07-29 MED ORDER — LISINOPRIL-HYDROCHLOROTHIAZIDE 10-12.5 MG PO TABS: 1.0000 | ORAL_TABLET | Freq: Every day | ORAL | Status: DC

## 2014-07-29 MED ORDER — SIMVASTATIN 20 MG PO TABS: ORAL_TABLET | ORAL | Status: DC

## 2014-07-29 MED ORDER — TERBINAFINE HCL 250 MG PO TABS: 250.0000 mg | ORAL_TABLET | Freq: Every day | ORAL | Status: DC

## 2014-07-29 NOTE — Progress Notes (Signed)
Pre visit review using our clinic review tool, if applicable. No additional management support is needed unless otherwise documented below in the visit note. 

## 2014-07-29 NOTE — Progress Notes (Signed)
   Subjective:    Patient ID: Marcus Grant, male    DOB: December 28, 1955, 58 y.o.   MRN: [id number]  HPI 58 yr old male for a cpx. He feels well. He is eating a healthy diet but doe snot get much exercise.    Review of Systems  Constitutional: Negative.   HENT: Negative.   Eyes: Negative.   Respiratory: Negative.   Cardiovascular: Negative.   Gastrointestinal: Negative.   Genitourinary: Negative.   Musculoskeletal: Negative.   Skin: Negative.   Neurological: Negative.   Psychiatric/Behavioral: Negative.        Objective:   Physical Exam  Constitutional: He is oriented to person, place, and time. He appears well-developed and well-nourished. No distress.  HENT:  Head: Normocephalic and atraumatic.  Right Ear: External ear normal.  Left Ear: External ear normal.  Nose: Nose normal.  Mouth/Throat: Oropharynx is clear and moist. No oropharyngeal exudate.  Eyes: Conjunctivae and EOM are normal. Pupils are equal, round, and reactive to light. Right eye exhibits no discharge. Left eye exhibits no discharge. No scleral icterus.  Neck: Neck supple. No JVD present. No tracheal deviation present. No thyromegaly present.  Cardiovascular: Normal rate, regular rhythm, normal heart sounds and intact distal pulses.  Exam reveals no gallop and no friction rub.   No murmur heard. EKG normal   Pulmonary/Chest: Effort normal and breath sounds normal. No respiratory distress. He has no wheezes. He has no rales. He exhibits no tenderness.  Abdominal: Soft. Bowel sounds are normal. He exhibits no distension and no mass. There is no tenderness. There is no rebound and no guarding.  Genitourinary: Rectum normal, prostate normal and penis normal. Guaiac negative stool. No penile tenderness.  Musculoskeletal: Normal range of motion. He exhibits no edema and no tenderness.  Lymphadenopathy:    He has no cervical adenopathy.  Neurological: He is alert and oriented to person, place, and time. He has  normal reflexes. No cranial nerve deficit. He exhibits normal muscle tone. Coordination normal.  Skin: Skin is warm and dry. No rash noted. He is not diaphoretic. No erythema. No pallor.  Psychiatric: He has a normal mood and affect. His behavior is normal. Judgment and thought content normal.          Assessment & Plan:  Well exam. Start on Lisinopril HCT daily. He will monitor the BP at home daily and report to [place] in one month

## 2015-07-26 ENCOUNTER — Other Ambulatory Visit (INDEPENDENT_AMBULATORY_CARE_PROVIDER_SITE_OTHER): Payer: 59

## 2015-07-26 DIAGNOSIS — Z0000 Encounter for general adult medical examination without abnormal findings: Secondary | ICD-10-CM | POA: Diagnosis not present

## 2015-07-26 LAB — CBC WITH DIFFERENTIAL/PLATELET
Basophils Absolute: 0 10*3/uL (ref 0.0–0.1)
Basophils Relative: 0.4 % (ref 0.0–3.0)
Eosinophils Absolute: 0.1 10*3/uL (ref 0.0–0.7)
Eosinophils Relative: 2.5 % (ref 0.0–5.0)
HCT: 47.5 % (ref 39.0–52.0)
Hemoglobin: 16.1 g/dL (ref 13.0–17.0)
Lymphocytes Relative: 35.2 % (ref 12.0–46.0)
Lymphs Abs: 1.9 10*3/uL (ref 0.7–4.0)
MCHC: 33.8 g/dL (ref 30.0–36.0)
MCV: 89.3 fl (ref 78.0–100.0)
Monocytes Absolute: 0.7 10*3/uL (ref 0.1–1.0)
Monocytes Relative: 13.6 % — ABNORMAL HIGH (ref 3.0–12.0)
Neutro Abs: 2.6 10*3/uL (ref 1.4–7.7)
Neutrophils Relative %: 48.3 % (ref 43.0–77.0)
Platelets: 253 10*3/uL (ref 150.0–400.0)
RBC: 5.32 Mil/uL (ref 4.22–5.81)
RDW: 13.6 % (ref 11.5–15.5)
WBC: 5.4 10*3/uL (ref 4.0–10.5)

## 2015-07-26 LAB — LIPID PANEL
Cholesterol: 117 mg/dL (ref 0–200)
HDL: 36.2 mg/dL — ABNORMAL LOW (ref >39.00)
LDL Cholesterol: 63 mg/dL (ref 0–99)
NonHDL: 80.85
Total CHOL/HDL Ratio: 3
Triglycerides: 90 mg/dL (ref 0.0–149.0)
VLDL: 18 mg/dL (ref 0.0–40.0)

## 2015-07-26 LAB — POCT URINALYSIS DIPSTICK
Bilirubin, UA: NEGATIVE
Glucose, UA: NEGATIVE
Ketones, UA: NEGATIVE
Leukocytes, UA: NEGATIVE
Nitrite, UA: NEGATIVE
Spec Grav, UA: 1.025
Urobilinogen, UA: 1
pH, UA: 6

## 2015-07-26 LAB — HEPATIC FUNCTION PANEL
ALT: 32 U/L (ref 0–53)
AST: 22 U/L (ref 0–37)
Albumin: 4.2 g/dL (ref 3.5–5.2)
Alkaline Phosphatase: 67 U/L (ref 39–117)
Bilirubin, Direct: 0.1 mg/dL (ref 0.0–0.3)
Total Bilirubin: 0.4 mg/dL (ref 0.2–1.2)
Total Protein: 6.7 g/dL (ref 6.0–8.3)

## 2015-07-26 LAB — BASIC METABOLIC PANEL
BUN: 16 mg/dL (ref 6–23)
CO2: 32 mEq/L (ref 19–32)
Calcium: 9.2 mg/dL (ref 8.4–10.5)
Chloride: 103 mEq/L (ref 96–112)
Creatinine, Ser: 0.87 mg/dL (ref 0.40–1.50)
GFR: 95.86 mL/min (ref >60.00)
Glucose, Bld: 102 mg/dL — ABNORMAL HIGH (ref 70–99)
Potassium: 4.4 mEq/L (ref 3.5–5.1)
Sodium: 143 mEq/L (ref 135–145)

## 2015-07-26 LAB — PSA: PSA: 0.73 ng/mL (ref 0.10–4.00)

## 2015-07-26 LAB — TSH: TSH: 1.53 u[IU]/mL (ref 0.35–4.50)

## 2015-07-28 ENCOUNTER — Other Ambulatory Visit: Payer: Self-pay | Admitting: Family Medicine

## 2015-08-02 ENCOUNTER — Ambulatory Visit (INDEPENDENT_AMBULATORY_CARE_PROVIDER_SITE_OTHER): Payer: 59 | Admitting: Family Medicine

## 2015-08-02 ENCOUNTER — Encounter: Payer: Self-pay | Admitting: Family Medicine

## 2015-08-02 VITALS — BP 129/78 | HR 101 | Temp 98.1°F | Ht 68.5 in | Wt 209.0 lb

## 2015-08-02 DIAGNOSIS — Z0000 Encounter for general adult medical examination without abnormal findings: Secondary | ICD-10-CM

## 2015-08-02 MED ORDER — PANTOPRAZOLE SODIUM 40 MG PO TBEC: 40.0000 mg | DELAYED_RELEASE_TABLET | Freq: Every day | ORAL | Status: DC

## 2015-08-02 MED ORDER — MONTELUKAST SODIUM 10 MG PO TABS: 10.0000 mg | ORAL_TABLET | Freq: Every day | ORAL | Status: DC

## 2015-08-02 NOTE — Progress Notes (Signed)
Pre visit review using our clinic review tool, if applicable. No additional management support is needed unless otherwise documented below in the visit note. 

## 2015-08-02 NOTE — Progress Notes (Signed)
   Subjective:    Patient ID: Marcus Grant, male    DOB: November 19, 1955, 59 y.o.   MRN: [id number]  HPI 59 yr old male for a cpx. He feels well but he asks to try a new allergy medication. He has used antihistamines like Zyrtec in the past but finds them to be sedating. He wants to try Singulair instead.    Review of Systems  Constitutional: Negative.   HENT: Negative.   Eyes: Negative.   Respiratory: Negative.   Cardiovascular: Negative.   Gastrointestinal: Negative.   Genitourinary: Negative.   Musculoskeletal: Negative.   Skin: Negative.   Neurological: Negative.   Psychiatric/Behavioral: Negative.        Objective:   Physical Exam  Constitutional: He is oriented to person, place, and time. He appears well-developed and well-nourished. No distress.  HENT:  Head: Normocephalic and atraumatic.  Right Ear: External ear normal.  Left Ear: External ear normal.  Nose: Nose normal.  Mouth/Throat: Oropharynx is clear and moist. No oropharyngeal exudate.  Eyes: Conjunctivae and EOM are normal. Pupils are equal, round, and reactive to light. Right eye exhibits no discharge. Left eye exhibits no discharge. No scleral icterus.  Neck: Neck supple. No JVD present. No tracheal deviation present. No thyromegaly present.  Cardiovascular: Normal rate, regular rhythm, normal heart sounds and intact distal pulses.  Exam reveals no gallop and no friction rub.   No murmur heard. EKG normal   Pulmonary/Chest: Effort normal and breath sounds normal. No respiratory distress. He has no wheezes. He has no rales. He exhibits no tenderness.  Abdominal: Soft. Bowel sounds are normal. He exhibits no distension and no mass. There is no tenderness. There is no rebound and no guarding.  Genitourinary: Rectum normal, prostate normal and penis normal. Guaiac negative stool. No penile tenderness.  Musculoskeletal: Normal range of motion. He exhibits no edema or tenderness.  Lymphadenopathy:    He has no  cervical adenopathy.  Neurological: He is alert and oriented to person, place, and time. He has normal reflexes. No cranial nerve deficit. He exhibits normal muscle tone. Coordination normal.  Skin: Skin is warm and dry. No rash noted. He is not diaphoretic. No erythema. No pallor.  Psychiatric: He has a normal mood and affect. His behavior is normal. Judgment and thought content normal.          Assessment & Plan:  Well exam. We discussed diet and exercise advice. He will try Singulair 10 mg daily.

## 2015-08-28 ENCOUNTER — Other Ambulatory Visit: Payer: Self-pay | Admitting: Family Medicine

## 2016-03-27 ENCOUNTER — Telehealth: Payer: Self-pay | Admitting: Family Medicine

## 2016-03-27 NOTE — Telephone Encounter (Signed)
Pt need new Rx for Transderm-Scop 1.5 mg (pt going on cruise at end of month)   Pharm:  [facility]

## 2016-03-27 NOTE — Telephone Encounter (Signed)
Call in one box of 10

## 2016-03-28 MED ORDER — SCOPOLAMINE 1 MG/3DAYS TD PT72: 1.0000 | MEDICATED_PATCH | TRANSDERMAL | Status: DC

## 2016-03-28 NOTE — Telephone Encounter (Signed)
I sent script e-scribe and left a voice message for pt. 

## 2016-07-24 ENCOUNTER — Other Ambulatory Visit: Payer: Self-pay | Admitting: Family Medicine

## 2016-08-18 ENCOUNTER — Other Ambulatory Visit (INDEPENDENT_AMBULATORY_CARE_PROVIDER_SITE_OTHER): Payer: 59

## 2016-08-18 DIAGNOSIS — Z0000 Encounter for general adult medical examination without abnormal findings: Secondary | ICD-10-CM | POA: Diagnosis not present

## 2016-08-18 LAB — POC URINALSYSI DIPSTICK (AUTOMATED)
Bilirubin, UA: NEGATIVE
Blood, UA: NEGATIVE
Glucose, UA: NEGATIVE
Ketones, UA: NEGATIVE
Leukocytes, UA: NEGATIVE
Nitrite, UA: NEGATIVE
Spec Grav, UA: 1.015
Urobilinogen, UA: 0.2
pH, UA: 7

## 2016-08-18 LAB — LIPID PANEL
Cholesterol: 162 mg/dL (ref 0–200)
HDL: 50 mg/dL (ref >39.00)
LDL Cholesterol: 89 mg/dL (ref 0–99)
NonHDL: 111.51
Total CHOL/HDL Ratio: 3
Triglycerides: 114 mg/dL (ref 0.0–149.0)
VLDL: 22.8 mg/dL (ref 0.0–40.0)

## 2016-08-18 LAB — PSA: PSA: 1.68 ng/mL (ref 0.10–4.00)

## 2016-08-18 LAB — CBC WITH DIFFERENTIAL/PLATELET
Basophils Absolute: 0 10*3/uL (ref 0.0–0.1)
Basophils Relative: 0.6 % (ref 0.0–3.0)
Eosinophils Absolute: 0.2 10*3/uL (ref 0.0–0.7)
Eosinophils Relative: 3.3 % (ref 0.0–5.0)
HCT: 46.3 % (ref 39.0–52.0)
Hemoglobin: 16 g/dL (ref 13.0–17.0)
Lymphocytes Relative: 27.1 % (ref 12.0–46.0)
Lymphs Abs: 2 10*3/uL (ref 0.7–4.0)
MCHC: 34.5 g/dL (ref 30.0–36.0)
MCV: 87.8 fl (ref 78.0–100.0)
Monocytes Absolute: 0.6 10*3/uL (ref 0.1–1.0)
Monocytes Relative: 7.8 % (ref 3.0–12.0)
Neutro Abs: 4.4 10*3/uL (ref 1.4–7.7)
Neutrophils Relative %: 61.2 % (ref 43.0–77.0)
Platelets: 280 10*3/uL (ref 150.0–400.0)
RBC: 5.28 Mil/uL (ref 4.22–5.81)
RDW: 13.4 % (ref 11.5–15.5)
WBC: 7.3 10*3/uL (ref 4.0–10.5)

## 2016-08-18 LAB — HEPATIC FUNCTION PANEL
ALT: 23 U/L (ref 0–53)
AST: 16 U/L (ref 0–37)
Albumin: 4.4 g/dL (ref 3.5–5.2)
Alkaline Phosphatase: 56 U/L (ref 39–117)
Bilirubin, Direct: 0.1 mg/dL (ref 0.0–0.3)
Total Bilirubin: 0.6 mg/dL (ref 0.2–1.2)
Total Protein: 6.9 g/dL (ref 6.0–8.3)

## 2016-08-18 LAB — BASIC METABOLIC PANEL
BUN: 14 mg/dL (ref 6–23)
CO2: 30 mEq/L (ref 19–32)
Calcium: 9.5 mg/dL (ref 8.4–10.5)
Chloride: 100 mEq/L (ref 96–112)
Creatinine, Ser: 0.92 mg/dL (ref 0.40–1.50)
GFR: 89.54 mL/min (ref >60.00)
Glucose, Bld: 92 mg/dL (ref 70–99)
Potassium: 4.8 mEq/L (ref 3.5–5.1)
Sodium: 139 mEq/L (ref 135–145)

## 2016-08-18 LAB — TSH: TSH: 1.55 u[IU]/mL (ref 0.35–4.50)

## 2016-08-22 ENCOUNTER — Encounter: Payer: 59 | Admitting: Family Medicine

## 2016-09-04 ENCOUNTER — Encounter: Payer: 59 | Admitting: Family Medicine

## 2016-09-06 ENCOUNTER — Encounter: Payer: Self-pay | Admitting: Family Medicine

## 2016-09-06 ENCOUNTER — Ambulatory Visit (INDEPENDENT_AMBULATORY_CARE_PROVIDER_SITE_OTHER): Payer: 59 | Admitting: Family Medicine

## 2016-09-06 VITALS — BP 144/88 | HR 94 | Temp 98.0°F | Ht 68.5 in | Wt 216.0 lb

## 2016-09-06 DIAGNOSIS — Z0000 Encounter for general adult medical examination without abnormal findings: Secondary | ICD-10-CM | POA: Diagnosis not present

## 2016-09-06 MED ORDER — PANTOPRAZOLE SODIUM 40 MG PO TBEC: 40.0000 mg | DELAYED_RELEASE_TABLET | Freq: Every day | ORAL | 3 refills | Status: DC

## 2016-09-06 NOTE — Progress Notes (Signed)
   Subjective:    Patient ID: Marcus Grant, male    DOB: 25-Aug-1956, 60 y.o.   MRN: [id number]  HPI 60 yr old male for a well exam. He feels fine. His BP at home is stable in the 120s over 70s.    Review of Systems  Constitutional: Negative.   HENT: Negative.   Eyes: Negative.   Respiratory: Negative.   Cardiovascular: Negative.   Gastrointestinal: Negative.   Genitourinary: Negative.   Musculoskeletal: Negative.   Skin: Negative.   Neurological: Negative.   Psychiatric/Behavioral: Negative.        Objective:   Physical Exam  Constitutional: He is oriented to person, place, and time. He appears well-developed and well-nourished. No distress.  HENT:  Head: Normocephalic and atraumatic.  Right Ear: External ear normal.  Left Ear: External ear normal.  Nose: Nose normal.  Mouth/Throat: Oropharynx is clear and moist. No oropharyngeal exudate.  Eyes: Conjunctivae and EOM are normal. Pupils are equal, round, and reactive to light. Right eye exhibits no discharge. Left eye exhibits no discharge. No scleral icterus.  Neck: Neck supple. No JVD present. No tracheal deviation present. No thyromegaly present.  Cardiovascular: Normal rate, regular rhythm, normal heart sounds and intact distal pulses.  Exam reveals no gallop and no friction rub.   No murmur heard. Pulmonary/Chest: Effort normal and breath sounds normal. No respiratory distress. He has no wheezes. He has no rales. He exhibits no tenderness.  Abdominal: Soft. Bowel sounds are normal. He exhibits no distension and no mass. There is no tenderness. There is no rebound and no guarding.  Genitourinary: Rectum normal, prostate normal and penis normal. Rectal exam shows guaiac negative stool. No penile tenderness.  Musculoskeletal: Normal range of motion. He exhibits no edema or tenderness.  Lymphadenopathy:    He has no cervical adenopathy.  Neurological: He is alert and oriented to person, place, and time. He has normal  reflexes. No cranial nerve deficit. He exhibits normal muscle tone. Coordination normal.  Skin: Skin is warm and dry. No rash noted. He is not diaphoretic. No erythema. No pallor.  Psychiatric: He has a normal mood and affect. His behavior is normal. Judgment and thought content normal.          Assessment & Plan:  Well exam. We discussed diet and exercise.  Laurey Morale, MD

## 2016-11-20 ENCOUNTER — Encounter: Payer: Self-pay | Admitting: Gastroenterology

## 2016-12-26 DIAGNOSIS — H2512 Age-related nuclear cataract, left eye: Secondary | ICD-10-CM | POA: Diagnosis not present

## 2016-12-26 DIAGNOSIS — H25012 Cortical age-related cataract, left eye: Secondary | ICD-10-CM | POA: Diagnosis not present

## 2016-12-26 DIAGNOSIS — H31002 Unspecified chorioretinal scars, left eye: Secondary | ICD-10-CM | POA: Diagnosis not present

## 2017-02-08 ENCOUNTER — Encounter: Payer: Self-pay | Admitting: Gastroenterology

## 2017-02-14 ENCOUNTER — Other Ambulatory Visit: Payer: Self-pay | Admitting: Family Medicine

## 2017-04-20 ENCOUNTER — Ambulatory Visit (AMBULATORY_SURGERY_CENTER): Payer: Self-pay

## 2017-04-20 VITALS — Ht 68.5 in | Wt 220.0 lb

## 2017-04-20 DIAGNOSIS — Z1211 Encounter for screening for malignant neoplasm of colon: Secondary | ICD-10-CM

## 2017-04-20 MED ORDER — SUPREP BOWEL PREP KIT 17.5-3.13-1.6 GM/177ML PO SOLN: 1.0000 | Freq: Once | ORAL | 0 refills | Status: AC

## 2017-04-20 NOTE — Progress Notes (Signed)
No allergies to eggs or soy No past problems with anesthesia No diet meds No home oxygen  Declined emmi 

## 2017-04-26 ENCOUNTER — Encounter: Payer: Self-pay | Admitting: Gastroenterology

## 2017-05-04 ENCOUNTER — Ambulatory Visit (AMBULATORY_SURGERY_CENTER): Payer: 59 | Admitting: Gastroenterology

## 2017-05-04 ENCOUNTER — Encounter: Payer: Self-pay | Admitting: Gastroenterology

## 2017-05-04 VITALS — BP 123/83 | HR 72 | Temp 98.6°F | Resp 16 | Ht 68.5 in | Wt 220.0 lb

## 2017-05-04 DIAGNOSIS — Z1211 Encounter for screening for malignant neoplasm of colon: Secondary | ICD-10-CM

## 2017-05-04 DIAGNOSIS — Z1212 Encounter for screening for malignant neoplasm of rectum: Secondary | ICD-10-CM

## 2017-05-04 DIAGNOSIS — K635 Polyp of colon: Secondary | ICD-10-CM

## 2017-05-04 DIAGNOSIS — D122 Benign neoplasm of ascending colon: Secondary | ICD-10-CM | POA: Diagnosis not present

## 2017-05-04 DIAGNOSIS — D123 Benign neoplasm of transverse colon: Secondary | ICD-10-CM | POA: Diagnosis not present

## 2017-05-04 DIAGNOSIS — D125 Benign neoplasm of sigmoid colon: Secondary | ICD-10-CM | POA: Diagnosis not present

## 2017-05-04 HISTORY — PX: COLONOSCOPY: SHX174

## 2017-05-04 MED ORDER — SODIUM CHLORIDE 0.9 % IV SOLN: 500.0000 mL | INTRAVENOUS | Status: AC

## 2017-05-04 NOTE — Patient Instructions (Signed)
Await pathology results  Information on polyps,diverticulosis,& hemorrhoids given to you today   YOU HAD AN ENDOSCOPIC PROCEDURE TODAY AT [facility]:   Refer to the procedure report that was given to you for any specific questions about what was found during the examination.  If the procedure report does not answer your questions, please call your gastroenterologist to clarify.  If you requested that your care partner not be given the details of your procedure findings, then the procedure report has been included in a sealed envelope for you to review at your convenience later.  YOU SHOULD EXPECT: Some feelings of bloating in the abdomen. Passage of more gas than usual.  Walking can help get rid of the air that was put into your GI tract during the procedure and reduce the bloating. If you had a lower endoscopy (such as a colonoscopy or flexible sigmoidoscopy) you may notice spotting of blood in your stool or on the toilet paper. If you underwent a bowel prep for your procedure, you may not have a normal bowel movement for a few days.  Please Note:  You might notice some irritation and congestion in your nose or some drainage.  This is from the oxygen used during your procedure.  There is no need for concern and it should clear up in a day or so.  SYMPTOMS TO REPORT IMMEDIATELY:   Following lower endoscopy (colonoscopy or flexible sigmoidoscopy):  Excessive amounts of blood in the stool  Significant tenderness or worsening of abdominal pains  Swelling of the abdomen that is new, acute  Fever of 100F or higher   Following upper endoscopy (EGD)  Vomiting of blood or coffee ground material  New chest pain or pain under the shoulder blades  Painful or persistently difficult swallowing  New shortness of breath  Fever of 100F or higher  Black, tarry-looking stools  For urgent or emergent issues, a gastroenterologist can be reached at any hour by calling [phone]  [phone].   DIET:  We do recommend a small meal at first, but then you may proceed to your regular diet.  Drink plenty of fluids but you should avoid alcoholic beverages for 24 hours.  ACTIVITY:  You should plan to take it easy for the rest of today and you should NOT DRIVE or use heavy machinery until tomorrow (because of the sedation medicines used during the test).    FOLLOW UP: Our staff will call the number listed on your records the next business day following your procedure to check on you and address any questions or concerns that you may have regarding the information given to you following your procedure. If we do not reach you, we will leave a message.  However, if you are feeling well and you are not experiencing any problems, there is no need to return our call.  We will assume that you have returned to your regular daily activities without incident.  If any biopsies were taken you will be contacted by phone or by letter within the next 1-3 weeks.  Please call us at [phone] if you have not heard about the biopsies in 3 weeks.    SIGNATURES/CONFIDENTIALITY: You and/or your care partner have signed paperwork which will be entered into your electronic medical record.  These signatures attest to the fact that that the information above on your After Visit Summary has been reviewed and is understood.  Full responsibility of the confidentiality of this discharge information lies with you  and/or your care-partner. 

## 2017-05-04 NOTE — Progress Notes (Signed)
Called to room to assist during endoscopic procedure.  Patient ID and intended procedure confirmed with present staff. Received instructions for my participation in the procedure from the performing physician.  

## 2017-05-04 NOTE — Progress Notes (Signed)
No changes in medical or surgical hx since PV 

## 2017-05-04 NOTE — Progress Notes (Signed)
To PACU VSS. Report to [email or web address] 

## 2017-05-04 NOTE — Op Note (Signed)
[facility] Patient Name: Marcus Grant Procedure Date: 05/04/2017 10:34 AM MRN: [id number] Endoscopist: Ladene Artist , MD Age: 61 Referring MD:  Date of Birth: [birth year] Gender: Male Account #: [id number] Procedure:                Colonoscopy Indications:              Screening for colorectal malignant neoplasm Medicines:                Monitored Anesthesia Care Procedure:                Pre-Anesthesia Assessment:                           - Prior to the procedure, a History and Physical                            was performed, and patient medications and                            allergies were reviewed. The patient's tolerance of                            previous anesthesia was also reviewed. The risks                            and benefits of the procedure and the sedation                            options and risks were discussed with the patient.                            All questions were answered, and informed consent                            was obtained. Prior Anticoagulants: The patient has                            taken no previous anticoagulant or antiplatelet                            agents. ASA Grade Assessment: II - A patient with                            mild systemic disease. After reviewing the risks                            and benefits, the patient was deemed in                            satisfactory condition to undergo the procedure.                           After obtaining informed consent, the colonoscope  was passed under direct vision. Throughout the                            procedure, the patient's blood pressure, pulse, and                            oxygen saturations were monitored continuously. The                            Colonoscope was introduced through the anus and                            advanced to the the cecum, identified by                            appendiceal orifice and  ileocecal valve. The                            ileocecal valve, appendiceal orifice, and rectum                            were photographed. The quality of the bowel                            preparation was excellent. The colonoscopy was                            performed without difficulty. The patient tolerated                            the procedure well. Scope In: 10:52:27 AM Scope Out: 11:03:48 AM Scope Withdrawal Time: 0 hours 9 minutes 41 seconds  Total Procedure Duration: 0 hours 11 minutes 21 seconds  Findings:                 The perianal and digital rectal examinations were                            normal.                           Three sessile polyps were found in the sigmoid                            colon, transverse colon and ascending colon. The                            polyps were 5 to 7 mm in size. These polyps were                            removed with a cold snare. Resection and retrieval                            were complete.  Many small-mouthed diverticula were found in the                            left colon. There was no evidence of diverticular                            bleeding.                           Internal hemorrhoids were found during                            retroflexion. The hemorrhoids were small and Grade                            I (internal hemorrhoids that do not prolapse).                           One medium-sized localized angiodysplastic lesion                            without bleeding was found in the cecum.                           The exam was otherwise without abnormality on                            direct and retroflexion views. Complications:            No immediate complications. Estimated blood loss:                            None. Estimated Blood Loss:     Estimated blood loss: none. Impression:               - Three 5 to 7 mm polyps in the sigmoid colon, in                             the transverse colon and in the ascending colon,                            removed with a cold snare. Resected and retrieved.                           - Cecal AVM                           - Mild diverticulosis in the left colon. There was                            no evidence of diverticular bleeding.                           - Internal hemorrhoids.                           -  The examination was otherwise normal on direct                            and retroflexion views. Recommendation:           - Repeat colonoscopy in 5 years for surveillance if                            polyp(s) are precancerous.                           - Patient has a contact number available for                            emergencies. The signs and symptoms of potential                            delayed complications were discussed with the                            patient. Return to normal activities tomorrow.                            Written discharge instructions were provided to the                            patient.                           - Resume previous diet.                           - Continue present medications.                           - Await pathology results. Ladene Artist, MD 05/04/2017 11:11:06 AM This report has been signed electronically.

## 2017-05-07 ENCOUNTER — Telehealth: Payer: Self-pay | Admitting: *Deleted

## 2017-05-07 ENCOUNTER — Telehealth: Payer: Self-pay

## 2017-05-07 NOTE — Telephone Encounter (Signed)
Name identifier, left a  Message.

## 2017-05-07 NOTE — Telephone Encounter (Signed)
  Follow up Call-  Call back number 05/04/2017  Post procedure Call Back phone  # [phone]  Permission to leave phone message Yes  Some recent data might be hidden     Patient questions:  Do you have a fever, pain , or abdominal swelling? No. Pain Score  0 *  Have you tolerated food without any problems? Yes.    Have you been able to return to your normal activities? Yes.    Do you have any questions about your discharge instructions: Diet   No. Medications  No. Follow up visit  No.  Do you have questions or concerns about your Care? No.  Actions: * If pain score is 4 or above: No action needed, pain <4.

## 2017-05-17 ENCOUNTER — Telehealth: Payer: Self-pay | Admitting: Gastroenterology

## 2017-05-17 NOTE — Telephone Encounter (Signed)
The pt has been advised that Dr Fuller Plan is out of the office this week and we will call him as soon as reviewed.

## 2017-05-17 NOTE — Telephone Encounter (Signed)
Left message on machine to call back  

## 2017-05-20 ENCOUNTER — Encounter: Payer: Self-pay | Admitting: Gastroenterology

## 2017-05-20 ENCOUNTER — Other Ambulatory Visit: Payer: Self-pay | Admitting: Family Medicine

## 2017-05-25 NOTE — Telephone Encounter (Signed)
Patient requesting path results. [facility] # [phone]

## 2017-05-28 NOTE — Telephone Encounter (Signed)
Patient notified that letter was sent.  He has the letter and has no questions at this time.

## 2017-06-28 DIAGNOSIS — Z23 Encounter for immunization: Secondary | ICD-10-CM | POA: Diagnosis not present

## 2017-08-11 ENCOUNTER — Other Ambulatory Visit: Payer: Self-pay | Admitting: Family Medicine

## 2017-08-14 ENCOUNTER — Other Ambulatory Visit: Payer: Self-pay | Admitting: Family Medicine

## 2017-08-16 ENCOUNTER — Telehealth: Payer: Self-pay | Admitting: Family Medicine

## 2017-08-16 MED ORDER — MONTELUKAST SODIUM 10 MG PO TABS: 10.0000 mg | ORAL_TABLET | Freq: Every day | ORAL | 0 refills | Status: DC

## 2017-08-16 NOTE — Telephone Encounter (Signed)
I sent script e-scribe to [facility]. 

## 2017-08-16 NOTE — Telephone Encounter (Signed)
° ° ° ° ° °  Pt request refill of the following:  montelukast (SINGULAIR) 10 MG tablet   Phamacy:  [facility]

## 2017-08-22 ENCOUNTER — Telehealth: Payer: Self-pay | Admitting: Family Medicine

## 2017-08-22 NOTE — Telephone Encounter (Signed)
Copied from [place] [id number]. Topic: Inquiry >> Aug 22, 2017 11:30 AM Scherrie Gerlach wrote: Pt would like rx for sea sick patch. Pt going on cruise next week.

## 2017-08-22 NOTE — Telephone Encounter (Signed)
Rx request 

## 2017-08-23 MED ORDER — SCOPOLAMINE 1 MG/3DAYS TD PT72: 1.0000 | MEDICATED_PATCH | TRANSDERMAL | 0 refills | Status: DC

## 2017-08-23 NOTE — Telephone Encounter (Signed)
I sent script e-scribe to [facility] and spoke with pt.  

## 2017-08-23 NOTE — Telephone Encounter (Signed)
Call in transdermal scopolamine patches, apply one every 3 days as needed, #one box with no rf

## 2017-09-10 ENCOUNTER — Other Ambulatory Visit: Payer: Self-pay | Admitting: Family Medicine

## 2017-09-13 ENCOUNTER — Encounter: Payer: Self-pay | Admitting: Family Medicine

## 2017-09-13 ENCOUNTER — Ambulatory Visit (INDEPENDENT_AMBULATORY_CARE_PROVIDER_SITE_OTHER): Payer: 59 | Admitting: Family Medicine

## 2017-09-13 VITALS — BP 130/80 | HR 94 | Temp 98.1°F | Ht 68.5 in | Wt 218.2 lb

## 2017-09-13 DIAGNOSIS — R6882 Decreased libido: Secondary | ICD-10-CM

## 2017-09-13 DIAGNOSIS — Z0000 Encounter for general adult medical examination without abnormal findings: Secondary | ICD-10-CM

## 2017-09-13 LAB — PSA: PSA: 0.96 ng/mL (ref 0.10–4.00)

## 2017-09-13 LAB — CBC WITH DIFFERENTIAL/PLATELET
Basophils Absolute: 0 10*3/uL (ref 0.0–0.1)
Basophils Relative: 0.6 % (ref 0.0–3.0)
Eosinophils Absolute: 0.2 10*3/uL (ref 0.0–0.7)
Eosinophils Relative: 3.4 % (ref 0.0–5.0)
HCT: 48.5 % (ref 39.0–52.0)
Hemoglobin: 16.1 g/dL (ref 13.0–17.0)
Lymphocytes Relative: 23 % (ref 12.0–46.0)
Lymphs Abs: 1.4 10*3/uL (ref 0.7–4.0)
MCHC: 33.2 g/dL (ref 30.0–36.0)
MCV: 90.7 fl (ref 78.0–100.0)
Monocytes Absolute: 0.5 10*3/uL (ref 0.1–1.0)
Monocytes Relative: 7.8 % (ref 3.0–12.0)
Neutro Abs: 4 10*3/uL (ref 1.4–7.7)
Neutrophils Relative %: 65.2 % (ref 43.0–77.0)
Platelets: 300 10*3/uL (ref 150.0–400.0)
RBC: 5.35 Mil/uL (ref 4.22–5.81)
RDW: 13 % (ref 11.5–15.5)
WBC: 6.2 10*3/uL (ref 4.0–10.5)

## 2017-09-13 LAB — LIPID PANEL
Cholesterol: 156 mg/dL (ref 0–200)
HDL: 44.8 mg/dL (ref >39.00)
LDL Cholesterol: 79 mg/dL (ref 0–99)
NonHDL: 110.81
Total CHOL/HDL Ratio: 3
Triglycerides: 160 mg/dL — ABNORMAL HIGH (ref 0.0–149.0)
VLDL: 32 mg/dL (ref 0.0–40.0)

## 2017-09-13 LAB — HEPATIC FUNCTION PANEL
ALT: 25 U/L (ref 0–53)
AST: 17 U/L (ref 0–37)
Albumin: 4.5 g/dL (ref 3.5–5.2)
Alkaline Phosphatase: 59 U/L (ref 39–117)
Bilirubin, Direct: 0.1 mg/dL (ref 0.0–0.3)
Total Bilirubin: 0.7 mg/dL (ref 0.2–1.2)
Total Protein: 6.9 g/dL (ref 6.0–8.3)

## 2017-09-13 LAB — BASIC METABOLIC PANEL
BUN: 16 mg/dL (ref 6–23)
CO2: 26 mEq/L (ref 19–32)
Calcium: 9.4 mg/dL (ref 8.4–10.5)
Chloride: 101 mEq/L (ref 96–112)
Creatinine, Ser: 0.82 mg/dL (ref 0.40–1.50)
GFR: 101.88 mL/min (ref >60.00)
Glucose, Bld: 89 mg/dL (ref 70–99)
Potassium: 4.4 mEq/L (ref 3.5–5.1)
Sodium: 137 mEq/L (ref 135–145)

## 2017-09-13 LAB — TESTOSTERONE: Testosterone: 229.68 ng/dL — ABNORMAL LOW (ref 300.00–890.00)

## 2017-09-13 LAB — TSH: TSH: 1.4 u[IU]/mL (ref 0.35–4.50)

## 2017-09-13 NOTE — Progress Notes (Signed)
   Subjective:    Patient ID: Marcus Grant, male    DOB: Jan 30, 1956, 61 y.o.   MRN: [id number]  HPI Here for a well exam. He feels fine but does ask about some low libido and some erection problems. He tried Viagra once but did not get good results.    Review of Systems  Constitutional: Negative.   HENT: Negative.   Eyes: Negative.   Respiratory: Negative.   Cardiovascular: Negative.   Gastrointestinal: Negative.   Genitourinary: Negative.   Musculoskeletal: Negative.   Skin: Negative.   Neurological: Negative.   Psychiatric/Behavioral: Negative.        Objective:   Physical Exam  Constitutional: He is oriented to person, place, and time. He appears well-developed and well-nourished. No distress.  HENT:  Head: Normocephalic and atraumatic.  Right Ear: External ear normal.  Left Ear: External ear normal.  Nose: Nose normal.  Mouth/Throat: Oropharynx is clear and moist. No oropharyngeal exudate.  Eyes: Conjunctivae and EOM are normal. Pupils are equal, round, and reactive to light. Right eye exhibits no discharge. Left eye exhibits no discharge. No scleral icterus.  Neck: Neck supple. No JVD present. No tracheal deviation present. No thyromegaly present.  Cardiovascular: Normal rate, regular rhythm, normal heart sounds and intact distal pulses. Exam reveals no gallop and no friction rub.  No murmur heard. Pulmonary/Chest: Effort normal and breath sounds normal. No respiratory distress. He has no wheezes. He has no rales. He exhibits no tenderness.  Abdominal: Soft. Bowel sounds are normal. He exhibits no distension and no mass. There is no tenderness. There is no rebound and no guarding.  Genitourinary: Rectum normal, prostate normal and penis normal. Rectal exam shows guaiac negative stool. No penile tenderness.  Musculoskeletal: Normal range of motion. He exhibits no edema or tenderness.  Lymphadenopathy:    He has no cervical adenopathy.  Neurological: He is alert  and oriented to person, place, and time. He has normal reflexes. No cranial nerve deficit. He exhibits normal muscle tone. Coordination normal.  Skin: Skin is warm and dry. No rash noted. He is not diaphoretic. No erythema. No pallor.  Psychiatric: He has a normal mood and affect. His behavior is normal. Judgment and thought content normal.          Assessment & Plan:  Well exam. We discussed diet and exercise. Get fasting labs. We will check a testosterone level as well.  Alysia Penna, MD

## 2017-09-27 ENCOUNTER — Telehealth: Payer: Self-pay

## 2017-09-27 NOTE — Telephone Encounter (Signed)
Pt notified of instructions and verbalized understanding. He will contact insurance and return call.

## 2017-09-27 NOTE — Telephone Encounter (Signed)
We can prescribe a testosterone product for him. Ask him to check with  his insurance company about which ones they will cover and report back to [place]

## 2017-09-27 NOTE — Telephone Encounter (Signed)
Spoke with the pt all of his lab results looked great however, his testosterone level was low and his triglycerides were high. Pt was advised to watch is diet avoid fatty, greases food, oils, butters and to get exercise. Pt wanted to know what he should do about the low Testerone level. Sent to PCP      Copied from [place] [phone]. Topic: Quick Communication - Lab Results >> Sep 26, 2017 12:33 PM Synthia Innocent wrote: Patient would like to speak to someone regarding lab >> Sep 26, 2017 12:52 PM Cox, Melburn Hake, CMA wrote: Labs are not resulted.

## 2017-11-09 ENCOUNTER — Other Ambulatory Visit: Payer: Self-pay | Admitting: Family Medicine

## 2017-11-09 NOTE — Telephone Encounter (Signed)
Last OV 11/03/2016

## 2018-06-19 ENCOUNTER — Other Ambulatory Visit: Payer: Self-pay | Admitting: Family Medicine

## 2018-09-16 ENCOUNTER — Ambulatory Visit (INDEPENDENT_AMBULATORY_CARE_PROVIDER_SITE_OTHER): Payer: 59 | Admitting: Family Medicine

## 2018-09-16 ENCOUNTER — Encounter: Payer: Self-pay | Admitting: Family Medicine

## 2018-09-16 ENCOUNTER — Other Ambulatory Visit: Payer: Self-pay | Admitting: Family Medicine

## 2018-09-16 VITALS — BP 110/70 | Temp 98.1°F | Ht 69.75 in | Wt 223.6 lb

## 2018-09-16 DIAGNOSIS — Z23 Encounter for immunization: Secondary | ICD-10-CM

## 2018-09-16 DIAGNOSIS — Z0000 Encounter for general adult medical examination without abnormal findings: Secondary | ICD-10-CM | POA: Diagnosis not present

## 2018-09-16 DIAGNOSIS — M25571 Pain in right ankle and joints of right foot: Secondary | ICD-10-CM | POA: Diagnosis not present

## 2018-09-16 LAB — LIPID PANEL
Cholesterol: 163 mg/dL (ref 0–200)
HDL: 47.2 mg/dL (ref >39.00)
LDL Cholesterol: 86 mg/dL (ref 0–99)
NonHDL: 115.52
Total CHOL/HDL Ratio: 3
Triglycerides: 150 mg/dL — ABNORMAL HIGH (ref 0.0–149.0)
VLDL: 30 mg/dL (ref 0.0–40.0)

## 2018-09-16 LAB — BASIC METABOLIC PANEL
BUN: 18 mg/dL (ref 6–23)
CO2: 30 mEq/L (ref 19–32)
Calcium: 9.8 mg/dL (ref 8.4–10.5)
Chloride: 101 mEq/L (ref 96–112)
Creatinine, Ser: 0.92 mg/dL (ref 0.40–1.50)
GFR: 88.91 mL/min (ref >60.00)
Glucose, Bld: 104 mg/dL — ABNORMAL HIGH (ref 70–99)
Potassium: 5.1 mEq/L (ref 3.5–5.1)
Sodium: 139 mEq/L (ref 135–145)

## 2018-09-16 LAB — POC URINALSYSI DIPSTICK (AUTOMATED)
Bilirubin, UA: NEGATIVE
Blood, UA: NEGATIVE
Glucose, UA: NEGATIVE
Ketones, UA: NEGATIVE
Leukocytes, UA: NEGATIVE
Nitrite, UA: NEGATIVE
Protein, UA: NEGATIVE
Spec Grav, UA: 1.025 (ref 1.010–1.025)
Urobilinogen, UA: 0.2 E.U./dL
pH, UA: 6 (ref 5.0–8.0)

## 2018-09-16 LAB — CBC WITH DIFFERENTIAL/PLATELET
Basophils Absolute: 0 10*3/uL (ref 0.0–0.1)
Basophils Relative: 0.7 % (ref 0.0–3.0)
Eosinophils Absolute: 0.2 10*3/uL (ref 0.0–0.7)
Eosinophils Relative: 3.3 % (ref 0.0–5.0)
HCT: 46.9 % (ref 39.0–52.0)
Hemoglobin: 15.7 g/dL (ref 13.0–17.0)
Lymphocytes Relative: 27.5 % (ref 12.0–46.0)
Lymphs Abs: 1.4 10*3/uL (ref 0.7–4.0)
MCHC: 33.5 g/dL (ref 30.0–36.0)
MCV: 89.8 fl (ref 78.0–100.0)
Monocytes Absolute: 0.4 10*3/uL (ref 0.1–1.0)
Monocytes Relative: 8.5 % (ref 3.0–12.0)
Neutro Abs: 3.1 10*3/uL (ref 1.4–7.7)
Neutrophils Relative %: 60 % (ref 43.0–77.0)
Platelets: 285 10*3/uL (ref 150.0–400.0)
RBC: 5.22 Mil/uL (ref 4.22–5.81)
RDW: 13.4 % (ref 11.5–15.5)
WBC: 5.2 10*3/uL (ref 4.0–10.5)

## 2018-09-16 LAB — PSA: PSA: 0.99 ng/mL (ref 0.10–4.00)

## 2018-09-16 LAB — HEPATIC FUNCTION PANEL
ALT: 29 U/L (ref 0–53)
AST: 23 U/L (ref 0–37)
Albumin: 4.4 g/dL (ref 3.5–5.2)
Alkaline Phosphatase: 53 U/L (ref 39–117)
Bilirubin, Direct: 0.1 mg/dL (ref 0.0–0.3)
Total Bilirubin: 0.5 mg/dL (ref 0.2–1.2)
Total Protein: 6.9 g/dL (ref 6.0–8.3)

## 2018-09-16 LAB — URIC ACID: Uric Acid, Serum: 6.1 mg/dL (ref 4.0–7.8)

## 2018-09-16 LAB — TSH: TSH: 1.86 u[IU]/mL (ref 0.35–4.50)

## 2018-09-16 MED ORDER — PANTOPRAZOLE SODIUM 40 MG PO TBEC: 40.0000 mg | DELAYED_RELEASE_TABLET | Freq: Every day | ORAL | 3 refills | Status: DC

## 2018-09-16 NOTE — Progress Notes (Signed)
   Subjective:    Patient ID: Marcus Grant, male    DOB: 1956-03-05, 62 y.o.   MRN: [id number]  HPI Here for a well exam. He feels fine today but has a few questions. About 2 weeks ago he had th eonset of pain in the right great toe which lasted a day and then went away. No redness or warmth or swelling. Also 2 weeks ago while standing in a shopping mall he had the onset of a burning pain in the anterior left thigh. This lasted one hour, and then when he sat down it resolved promptly. No hx of back pain.    Review of Systems  Constitutional: Negative.   HENT: Negative.   Eyes: Negative.   Respiratory: Negative.   Cardiovascular: Negative.   Gastrointestinal: Negative.   Genitourinary: Negative.   Musculoskeletal: Positive for arthralgias and myalgias.  Skin: Negative.   Neurological: Negative.   Psychiatric/Behavioral: Negative.        Objective:   Physical Exam  Constitutional: He is oriented to person, place, and time. He appears well-developed and well-nourished. No distress.  HENT:  Head: Normocephalic and atraumatic.  Right Ear: External ear normal.  Left Ear: External ear normal.  Nose: Nose normal.  Mouth/Throat: Oropharynx is clear and moist. No oropharyngeal exudate.  Eyes: Pupils are equal, round, and reactive to light. Conjunctivae and EOM are normal. Right eye exhibits no discharge. Left eye exhibits no discharge. No scleral icterus.  Neck: Neck supple. No JVD present. No tracheal deviation present. No thyromegaly present.  Cardiovascular: Normal rate, regular rhythm, normal heart sounds and intact distal pulses. Exam reveals no gallop and no friction rub.  No murmur heard. Pulmonary/Chest: Effort normal and breath sounds normal. No respiratory distress. He has no wheezes. He has no rales. He exhibits no tenderness.  Abdominal: Soft. Bowel sounds are normal. He exhibits no distension and no mass. There is no tenderness. There is no rebound and no guarding.    Genitourinary: Rectum normal, prostate normal and penis normal. Rectal exam shows guaiac negative stool. No penile tenderness.  Musculoskeletal: Normal range of motion. He exhibits no edema or tenderness.  Lymphadenopathy:    He has no cervical adenopathy.  Neurological: He is alert and oriented to person, place, and time. He has normal reflexes. He displays normal reflexes. No cranial nerve deficit. He exhibits normal muscle tone. Coordination normal.  Skin: Skin is warm and dry. No rash noted. He is not diaphoretic. No erythema. No pallor.  Psychiatric: He has a normal mood and affect. His behavior is normal. Judgment and thought content normal.          Assessment & Plan:  Well exam. We discussed diet and exercise. Get fasting labs. I am not sure how to characterize the toe pain and the back pain, but he will follow up if they recur.  Alysia Penna, MD

## 2018-09-20 ENCOUNTER — Telehealth: Payer: Self-pay | Admitting: Family Medicine

## 2018-09-20 ENCOUNTER — Encounter: Payer: Self-pay | Admitting: *Deleted

## 2018-09-20 MED ORDER — SILDENAFIL CITRATE 100 MG PO TABS: 100.0000 mg | ORAL_TABLET | Freq: Every day | ORAL | 11 refills | Status: DC | PRN

## 2018-09-20 NOTE — Telephone Encounter (Signed)
Call in Sildenafil 100 mg to take prn, #10 with 11 rf

## 2018-09-20 NOTE — Telephone Encounter (Signed)
Dr. Sarajane Jews please advise on rx.

## 2018-09-20 NOTE — Telephone Encounter (Signed)
Medication has been sent to the pharmacy. 

## 2018-09-20 NOTE — Telephone Encounter (Signed)
Copied from [place] [id number]. Topic: General - Other >> Sep 20, 2018  8:31 AM Keene Breath wrote: Reason for CRM: Patient called to request that the doctor send a script for the generic for Viagra, which they discussed at his last office visit.  Please advise.  CB# [phone]

## 2018-10-04 ENCOUNTER — Ambulatory Visit: Payer: 59 | Admitting: Family Medicine

## 2018-10-04 ENCOUNTER — Encounter: Payer: Self-pay | Admitting: Family Medicine

## 2018-10-04 VITALS — BP 122/64 | HR 79 | Temp 98.5°F | Wt 223.5 lb

## 2018-10-04 DIAGNOSIS — J209 Acute bronchitis, unspecified: Secondary | ICD-10-CM

## 2018-10-04 MED ORDER — METHYLPREDNISOLONE ACETATE 80 MG/ML IJ SUSP
120.0000 mg | Freq: Once | INTRAMUSCULAR | Status: AC
  Administered 2018-10-04: 120 mg via INTRAMUSCULAR

## 2018-10-04 MED ORDER — AZITHROMYCIN 250 MG PO TABS: ORAL_TABLET | ORAL | 0 refills | Status: DC

## 2018-10-04 NOTE — Progress Notes (Signed)
   Subjective:    Patient ID: Marcus Grant, male    DOB: 1955-10-12, 62 y.o.   MRN: [id number]  HPI Here for 4 days of chest tightness and coughing up yellow sputum. No fever.    Review of Systems  Constitutional: Negative.   HENT: Negative.   Eyes: Negative.   Respiratory: Positive for cough and chest tightness. Negative for shortness of breath and wheezing.        Objective:   Physical Exam Constitutional:      Appearance: Normal appearance.  HENT:     Right Ear: Tympanic membrane and ear canal normal.     Left Ear: Tympanic membrane and ear canal normal.     Nose: Nose normal.     Mouth/Throat:     Pharynx: Oropharynx is clear.  Eyes:     Conjunctiva/sclera: Conjunctivae normal.  Pulmonary:     Effort: Pulmonary effort is normal. No respiratory distress.     Breath sounds: No stridor. No wheezing or rales.     Comments: Scattered rhonchi  Neurological:     Mental Status: He is alert.           Assessment & Plan:  Bronchitis, treat with a Zpack and a steroid shot. Add Delsym prn.  Alysia Penna, MD

## 2018-10-04 NOTE — Addendum Note (Signed)
Addended by: Elie Confer on: 10/04/2018 12:38 PM   Modules accepted: Orders

## 2018-10-18 ENCOUNTER — Other Ambulatory Visit: Payer: Self-pay | Admitting: Family Medicine

## 2018-10-18 MED ORDER — LISINOPRIL-HYDROCHLOROTHIAZIDE 10-12.5 MG PO TABS: ORAL_TABLET | ORAL | 3 refills | Status: DC

## 2018-10-18 MED ORDER — SIMVASTATIN 20 MG PO TABS: 20.0000 mg | ORAL_TABLET | Freq: Every day | ORAL | 3 refills | Status: DC

## 2018-11-11 ENCOUNTER — Other Ambulatory Visit: Payer: Self-pay | Admitting: Family Medicine

## 2019-01-06 ENCOUNTER — Other Ambulatory Visit: Payer: Self-pay | Admitting: Family Medicine

## 2019-07-17 ENCOUNTER — Encounter: Payer: Self-pay | Admitting: Family Medicine

## 2019-07-17 DIAGNOSIS — M109 Gout, unspecified: Secondary | ICD-10-CM

## 2019-07-17 DIAGNOSIS — Z0000 Encounter for general adult medical examination without abnormal findings: Secondary | ICD-10-CM

## 2019-07-18 NOTE — Telephone Encounter (Signed)
I put in future orders so he can schedule the labs the morning of his appt

## 2019-07-31 ENCOUNTER — Encounter: Payer: Self-pay | Admitting: Family Medicine

## 2019-08-01 NOTE — Telephone Encounter (Unsigned)
Copied from [place] [id number]. Topic: General - Inquiry >> Aug 01, 2019 10:28 AM Mathis Bud wrote: Reason for CRM: patient would like to schedule labs before CPE is done.   Call back [phone]

## 2019-09-08 ENCOUNTER — Other Ambulatory Visit (INDEPENDENT_AMBULATORY_CARE_PROVIDER_SITE_OTHER): Payer: PRIVATE HEALTH INSURANCE

## 2019-09-08 ENCOUNTER — Other Ambulatory Visit: Payer: Self-pay

## 2019-09-08 DIAGNOSIS — M109 Gout, unspecified: Secondary | ICD-10-CM | POA: Diagnosis not present

## 2019-09-08 DIAGNOSIS — Z0000 Encounter for general adult medical examination without abnormal findings: Secondary | ICD-10-CM

## 2019-09-08 LAB — HEPATIC FUNCTION PANEL
ALT: 28 U/L (ref 0–53)
AST: 21 U/L (ref 0–37)
Albumin: 4.1 g/dL (ref 3.5–5.2)
Alkaline Phosphatase: 55 U/L (ref 39–117)
Bilirubin, Direct: 0.1 mg/dL (ref 0.0–0.3)
Total Bilirubin: 0.8 mg/dL (ref 0.2–1.2)
Total Protein: 6.8 g/dL (ref 6.0–8.3)

## 2019-09-08 LAB — CBC WITH DIFFERENTIAL/PLATELET
Basophils Absolute: 0 10*3/uL (ref 0.0–0.1)
Basophils Relative: 0.3 % (ref 0.0–3.0)
Eosinophils Absolute: 0.4 10*3/uL (ref 0.0–0.7)
Eosinophils Relative: 6.5 % — ABNORMAL HIGH (ref 0.0–5.0)
HCT: 46.1 % (ref 39.0–52.0)
Hemoglobin: 15.7 g/dL (ref 13.0–17.0)
Lymphocytes Relative: 27.2 % (ref 12.0–46.0)
Lymphs Abs: 1.7 10*3/uL (ref 0.7–4.0)
MCHC: 34 g/dL (ref 30.0–36.0)
MCV: 89.5 fl (ref 78.0–100.0)
Monocytes Absolute: 0.5 10*3/uL (ref 0.1–1.0)
Monocytes Relative: 8 % (ref 3.0–12.0)
Neutro Abs: 3.6 10*3/uL (ref 1.4–7.7)
Neutrophils Relative %: 58 % (ref 43.0–77.0)
Platelets: 258 10*3/uL (ref 150.0–400.0)
RBC: 5.15 Mil/uL (ref 4.22–5.81)
RDW: 13.6 % (ref 11.5–15.5)
WBC: 6.2 10*3/uL (ref 4.0–10.5)

## 2019-09-08 LAB — LIPID PANEL
Cholesterol: 164 mg/dL (ref 0–200)
HDL: 44.7 mg/dL (ref >39.00)
LDL Cholesterol: 92 mg/dL (ref 0–99)
NonHDL: 119.43
Total CHOL/HDL Ratio: 4
Triglycerides: 139 mg/dL (ref 0.0–149.0)
VLDL: 27.8 mg/dL (ref 0.0–40.0)

## 2019-09-08 LAB — BASIC METABOLIC PANEL
BUN: 18 mg/dL (ref 6–23)
CO2: 28 mEq/L (ref 19–32)
Calcium: 9.4 mg/dL (ref 8.4–10.5)
Chloride: 99 mEq/L (ref 96–112)
Creatinine, Ser: 0.88 mg/dL (ref 0.40–1.50)
GFR: 87.77 mL/min (ref >60.00)
Glucose, Bld: 101 mg/dL — ABNORMAL HIGH (ref 70–99)
Potassium: 3.9 mEq/L (ref 3.5–5.1)
Sodium: 139 mEq/L (ref 135–145)

## 2019-09-08 LAB — PSA: PSA: 0.72 ng/mL (ref 0.10–4.00)

## 2019-09-08 LAB — POC URINALSYSI DIPSTICK (AUTOMATED)
Glucose, UA: NEGATIVE
Leukocytes, UA: NEGATIVE
Protein, UA: POSITIVE — AB
Spec Grav, UA: >=1.03 — AB (ref 1.010–1.025)
Urobilinogen, UA: 0.2 E.U./dL
pH, UA: 6 (ref 5.0–8.0)

## 2019-09-08 LAB — TSH: TSH: 2.24 u[IU]/mL (ref 0.35–4.50)

## 2019-09-08 LAB — URIC ACID: Uric Acid, Serum: 6.8 mg/dL (ref 4.0–7.8)

## 2019-09-11 ENCOUNTER — Other Ambulatory Visit: Payer: Self-pay | Admitting: Family Medicine

## 2019-09-18 ENCOUNTER — Other Ambulatory Visit: Payer: Self-pay

## 2019-09-19 ENCOUNTER — Ambulatory Visit (INDEPENDENT_AMBULATORY_CARE_PROVIDER_SITE_OTHER): Payer: PRIVATE HEALTH INSURANCE | Admitting: Family Medicine

## 2019-09-19 ENCOUNTER — Encounter: Payer: Self-pay | Admitting: Family Medicine

## 2019-09-19 VITALS — BP 120/78 | HR 115 | Temp 98.2°F | Ht 69.0 in | Wt 225.0 lb

## 2019-09-19 DIAGNOSIS — Z0000 Encounter for general adult medical examination without abnormal findings: Secondary | ICD-10-CM | POA: Diagnosis not present

## 2019-09-19 DIAGNOSIS — M79671 Pain in right foot: Secondary | ICD-10-CM | POA: Diagnosis not present

## 2019-09-19 DIAGNOSIS — M79672 Pain in left foot: Secondary | ICD-10-CM

## 2019-09-19 LAB — URIC ACID: Uric Acid, Serum: 6.3 mg/dL (ref 4.0–7.8)

## 2019-09-19 MED ORDER — LISINOPRIL-HYDROCHLOROTHIAZIDE 10-12.5 MG PO TABS: ORAL_TABLET | ORAL | 3 refills | Status: DC

## 2019-09-19 MED ORDER — SIMVASTATIN 20 MG PO TABS: 20.0000 mg | ORAL_TABLET | Freq: Every day | ORAL | 3 refills | Status: DC

## 2019-09-19 MED ORDER — TADALAFIL 20 MG PO TABS: 20.0000 mg | ORAL_TABLET | Freq: Every day | ORAL | 11 refills | Status: DC | PRN

## 2019-09-19 NOTE — Patient Instructions (Signed)
Health Maintenance Due  Topic Date Due  . Hepatitis C Screening  01/12/1956  . HIV Screening  10/05/1972  . INFLUENZA VACCINE  05/10/2019    Depression screen PHQ 2/9 09/13/2017  Decreased Interest 0  Down, Depressed, Hopeless 0  PHQ - 2 Score 0

## 2019-09-19 NOTE — Progress Notes (Signed)
Subjective:    Patient ID: Marcus Grant, male    DOB: [birth year], 63 y.o.   MRN: [id number]  HPI Here for a well exam. He is doing well in general except for the past 4 weeks he has had intermittent pains along the lateral edges of both feet. 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 There is no redness or warmth or swelling. He bought new shoes and started wearing arch supports, and this has helped a lot. He wonders of he could have gout. Viagra works for him makes him feel "hung over" the next morning.     Review of Systems  Constitutional: Negative.   HENT: Negative.   Eyes: Negative.   Respiratory: Negative.   Cardiovascular: Negative.   Gastrointestinal: Negative.   Genitourinary: Negative.   Musculoskeletal: Positive for arthralgias.  Skin: Negative.   Neurological: Negative.   Psychiatric/Behavioral: Negative.        Objective:   Physical Exam Constitutional:      General: He is not in acute distress.    Appearance: He is well-developed. He is obese. He is not diaphoretic.  HENT:     Head: Normocephalic and atraumatic.     Right Ear: External ear normal.     Left Ear: External ear normal.     Nose: Nose normal.     Mouth/Throat:     Pharynx: No oropharyngeal exudate.  Eyes:     General: No scleral icterus.       Right eye: No discharge.        Left eye: No discharge.     Conjunctiva/sclera: Conjunctivae normal.     Pupils: Pupils are equal, round, and reactive to light.  Neck:     Thyroid: No thyromegaly.     Vascular: No JVD.     Trachea: No tracheal deviation.  Cardiovascular:     Rate and Rhythm: Normal rate and regular rhythm.     Heart sounds: Normal heart sounds. No murmur. No friction rub. No gallop.   Pulmonary:     Effort: Pulmonary effort is normal. No respiratory distress.     Breath sounds: Normal breath sounds. No wheezing or rales.  Chest:   Chest wall: No tenderness.  Abdominal:     General: Bowel sounds are normal. There is no distension.     Palpations: Abdomen is soft. There is no mass.     Tenderness: There is no abdominal tenderness. There is no guarding or rebound.     Comments: Small non-tender reducible umbilical hernia   Genitourinary:    Penis: Normal. No tenderness.      Testes: Normal.     Prostate: Normal.     Rectum: Normal. Guaiac result negative.  Musculoskeletal:        General: No tenderness. Normal range of motion.     Cervical back: Neck supple.     Comments: His feet are completely normal on exam except for toenail fungus   Lymphadenopathy:     Cervical: No cervical adenopathy.  Skin:    General: Skin is warm and dry.     Coloration: Skin is not pale.     Findings: No erythema or rash.  Neurological:     Mental Status: He is alert and oriented to person, place, and time.     Cranial Nerves: No cranial nerve deficit.  Motor: No abnormal muscle tone.     Coordination: Coordination normal.     Deep Tendon Reflexes: Reflexes are normal and symmetric. Reflexes normal.  Psychiatric:        Behavior: Behavior normal.        Thought Content: Thought content normal.        Judgment: Judgment normal.           Assessment & Plan:  Well exam. We discussed diet and exercise. The etiology of his foot pain is not clear, but he seems to be responding well to wearing arch supports. He will try Cialis instead of Viagra for the ED. I do not think he has gout but at his request we will check a uric acid level.  Alysia Penna, MD

## 2019-10-10 ENCOUNTER — Other Ambulatory Visit: Payer: Self-pay | Admitting: Family Medicine

## 2019-12-06 ENCOUNTER — Other Ambulatory Visit: Payer: Self-pay | Admitting: Family Medicine

## 2020-01-10 ENCOUNTER — Ambulatory Visit: Payer: PRIVATE HEALTH INSURANCE | Attending: Internal Medicine

## 2020-01-10 DIAGNOSIS — Z23 Encounter for immunization: Secondary | ICD-10-CM

## 2020-01-10 NOTE — Progress Notes (Signed)
   Covid-19 Vaccination Clinic  Name:  [name]    MRN: [id number] DOB: 09/15/1956  01/10/2020  Marcus Grant was observed post Covid-19 immunization for 15 minutes without incident. He was provided with Vaccine Information Sheet and instruction to access the V-Safe system.   Marcus Grant was instructed to call 911 with any severe reactions post vaccine: Marland Kitchen Difficulty breathing  . Swelling of face and throat  . A fast heartbeat  . A bad rash all over body  . Dizziness and weakness   Immunizations Administered    Name Date Dose VIS Date Route   Pfizer COVID-19 Vaccine 01/10/2020 10:36 AM 0.3 mL 09/19/2019 Intramuscular   Manufacturer: [employer], [employer]   Lot: [id number]   [place]: [id number]

## 2020-01-27 DIAGNOSIS — H9313 Tinnitus, bilateral: Secondary | ICD-10-CM | POA: Insufficient documentation

## 2020-01-27 DIAGNOSIS — H90A31 Mixed conductive and sensorineural hearing loss, unilateral, right ear with restricted hearing on the contralateral side: Secondary | ICD-10-CM | POA: Insufficient documentation

## 2020-02-04 ENCOUNTER — Ambulatory Visit: Payer: PRIVATE HEALTH INSURANCE | Attending: Internal Medicine

## 2020-02-04 DIAGNOSIS — Z23 Encounter for immunization: Secondary | ICD-10-CM

## 2020-02-04 NOTE — Progress Notes (Signed)
   Covid-19 Vaccination Clinic  Name:  Marcus Grant    MRN: [id number] DOB: 1956/04/07  02/04/2020  [name] was observed post Covid-19 immunization for 15 minutes without incident. He was provided with Vaccine Information Sheet and instruction to access the V-Safe system.   [name] was instructed to call 911 with any severe reactions post vaccine: Marland Kitchen Difficulty breathing  . Swelling of face and throat  . A fast heartbeat  . A bad rash all over body  . Dizziness and weakness   Immunizations Administered    Name Date Dose VIS Date Route   Pfizer COVID-19 Vaccine 02/04/2020  4:04 PM 0.3 mL 12/03/2018 Intramuscular   Manufacturer: [place]   Lot: [id number]   [place]: [id number]

## 2020-02-17 ENCOUNTER — Other Ambulatory Visit: Payer: Self-pay | Admitting: Otolaryngology

## 2020-02-17 DIAGNOSIS — H9190 Unspecified hearing loss, unspecified ear: Secondary | ICD-10-CM

## 2020-03-17 ENCOUNTER — Other Ambulatory Visit: Payer: PRIVATE HEALTH INSURANCE

## 2020-04-05 ENCOUNTER — Other Ambulatory Visit: Payer: Self-pay

## 2020-04-05 ENCOUNTER — Other Ambulatory Visit: Payer: Self-pay | Admitting: Otolaryngology

## 2020-04-05 ENCOUNTER — Ambulatory Visit
Admission: RE | Admit: 2020-04-05 | Discharge: 2020-04-05 | Disposition: A | Discharge status: Home / Self Care | Payer: PRIVATE HEALTH INSURANCE | Source: Ambulatory Visit | Attending: Otolaryngology | Admitting: Otolaryngology

## 2020-04-05 DIAGNOSIS — W3400XD Accidental discharge from unspecified firearms or gun, subsequent encounter: Secondary | ICD-10-CM

## 2020-04-05 DIAGNOSIS — H9190 Unspecified hearing loss, unspecified ear: Secondary | ICD-10-CM

## 2020-04-05 MED ORDER — GADOBENATE DIMEGLUMINE 529 MG/ML IV SOLN
20.0000 mL | Freq: Once | INTRAVENOUS | Status: AC | PRN
  Administered 2020-04-05: 20 mL via INTRAVENOUS

## 2020-08-31 ENCOUNTER — Telehealth: Payer: Self-pay | Admitting: Family Medicine

## 2020-08-31 DIAGNOSIS — N401 Enlarged prostate with lower urinary tract symptoms: Secondary | ICD-10-CM

## 2020-08-31 DIAGNOSIS — E785 Hyperlipidemia, unspecified: Secondary | ICD-10-CM

## 2020-08-31 DIAGNOSIS — I10 Essential (primary) hypertension: Secondary | ICD-10-CM

## 2020-08-31 DIAGNOSIS — N138 Other obstructive and reflux uropathy: Secondary | ICD-10-CM

## 2020-08-31 DIAGNOSIS — M1A9XX0 Chronic gout, unspecified, without tophus (tophi): Secondary | ICD-10-CM

## 2020-08-31 NOTE — Telephone Encounter (Signed)
Patient is calling and stated that he has a cpe scheduled for 12/15 at 1pm and wanted to see if he can come in a week prior to do labs because he is not able to fast for that long period of time , please advise. CB is [phone]

## 2020-08-31 NOTE — Telephone Encounter (Signed)
I put in the orders, so he can make a lab appt

## 2020-09-01 NOTE — Telephone Encounter (Signed)
Okay to schedule  lab appt prior to cpe, thanks!

## 2020-09-01 NOTE — Telephone Encounter (Signed)
Lvm to call back to schedule lab appointment.

## 2020-09-15 ENCOUNTER — Other Ambulatory Visit (INDEPENDENT_AMBULATORY_CARE_PROVIDER_SITE_OTHER): Payer: PRIVATE HEALTH INSURANCE

## 2020-09-15 ENCOUNTER — Other Ambulatory Visit: Payer: Self-pay

## 2020-09-15 DIAGNOSIS — M1A9XX0 Chronic gout, unspecified, without tophus (tophi): Secondary | ICD-10-CM

## 2020-09-15 DIAGNOSIS — N138 Other obstructive and reflux uropathy: Secondary | ICD-10-CM

## 2020-09-15 DIAGNOSIS — N401 Enlarged prostate with lower urinary tract symptoms: Secondary | ICD-10-CM

## 2020-09-15 DIAGNOSIS — I10 Essential (primary) hypertension: Secondary | ICD-10-CM | POA: Diagnosis not present

## 2020-09-15 DIAGNOSIS — E785 Hyperlipidemia, unspecified: Secondary | ICD-10-CM

## 2020-09-15 LAB — CBC WITH DIFFERENTIAL/PLATELET
Basophils Absolute: 0 10*3/uL (ref 0.0–0.1)
Basophils Relative: 0.6 % (ref 0.0–3.0)
Eosinophils Absolute: 0.3 10*3/uL (ref 0.0–0.7)
Eosinophils Relative: 4 % (ref 0.0–5.0)
HCT: 46 % (ref 39.0–52.0)
Hemoglobin: 15.5 g/dL (ref 13.0–17.0)
Lymphocytes Relative: 27.5 % (ref 12.0–46.0)
Lymphs Abs: 1.8 10*3/uL (ref 0.7–4.0)
MCHC: 33.7 g/dL (ref 30.0–36.0)
MCV: 89.5 fl (ref 78.0–100.0)
Monocytes Absolute: 0.6 10*3/uL (ref 0.1–1.0)
Monocytes Relative: 8.9 % (ref 3.0–12.0)
Neutro Abs: 3.8 10*3/uL (ref 1.4–7.7)
Neutrophils Relative %: 59 % (ref 43.0–77.0)
Platelets: 258 10*3/uL (ref 150.0–400.0)
RBC: 5.14 Mil/uL (ref 4.22–5.81)
RDW: 13.7 % (ref 11.5–15.5)
WBC: 6.5 10*3/uL (ref 4.0–10.5)

## 2020-09-15 LAB — HEPATIC FUNCTION PANEL
ALT: 23 U/L (ref 0–53)
AST: 17 U/L (ref 0–37)
Albumin: 4.2 g/dL (ref 3.5–5.2)
Alkaline Phosphatase: 55 U/L (ref 39–117)
Bilirubin, Direct: 0.1 mg/dL (ref 0.0–0.3)
Total Bilirubin: 0.8 mg/dL (ref 0.2–1.2)
Total Protein: 6.7 g/dL (ref 6.0–8.3)

## 2020-09-15 LAB — LIPID PANEL
Cholesterol: 155 mg/dL (ref 0–200)
HDL: 43.3 mg/dL (ref >39.00)
LDL Cholesterol: 84 mg/dL (ref 0–99)
NonHDL: 112.03
Total CHOL/HDL Ratio: 4
Triglycerides: 139 mg/dL (ref 0.0–149.0)
VLDL: 27.8 mg/dL (ref 0.0–40.0)

## 2020-09-15 LAB — PSA: PSA: 0.7 ng/mL (ref 0.10–4.00)

## 2020-09-15 LAB — BASIC METABOLIC PANEL
BUN: 16 mg/dL (ref 6–23)
CO2: 31 mEq/L (ref 19–32)
Calcium: 9.5 mg/dL (ref 8.4–10.5)
Chloride: 101 mEq/L (ref 96–112)
Creatinine, Ser: 0.99 mg/dL (ref 0.40–1.50)
GFR: 81.39 mL/min (ref >60.00)
Glucose, Bld: 110 mg/dL — ABNORMAL HIGH (ref 70–99)
Potassium: 4.6 mEq/L (ref 3.5–5.1)
Sodium: 139 mEq/L (ref 135–145)

## 2020-09-15 LAB — TSH: TSH: 2.04 u[IU]/mL (ref 0.35–4.50)

## 2020-09-15 LAB — URIC ACID: Uric Acid, Serum: 7 mg/dL (ref 4.0–7.8)

## 2020-09-15 NOTE — Addendum Note (Signed)
Addended by: Marrion Coy on: 09/15/2020 07:48 AM   Modules accepted: Orders

## 2020-09-22 ENCOUNTER — Encounter: Payer: Self-pay | Admitting: Family Medicine

## 2020-09-22 ENCOUNTER — Ambulatory Visit (INDEPENDENT_AMBULATORY_CARE_PROVIDER_SITE_OTHER): Payer: PRIVATE HEALTH INSURANCE | Admitting: Family Medicine

## 2020-09-22 ENCOUNTER — Other Ambulatory Visit: Payer: Self-pay

## 2020-09-22 VITALS — BP 108/64 | HR 90 | Temp 98.3°F | Resp 16 | Ht 68.5 in | Wt 217.6 lb

## 2020-09-22 DIAGNOSIS — M25562 Pain in left knee: Secondary | ICD-10-CM | POA: Diagnosis not present

## 2020-09-22 DIAGNOSIS — Z0000 Encounter for general adult medical examination without abnormal findings: Secondary | ICD-10-CM

## 2020-09-22 DIAGNOSIS — G8929 Other chronic pain: Secondary | ICD-10-CM

## 2020-09-22 DIAGNOSIS — R739 Hyperglycemia, unspecified: Secondary | ICD-10-CM | POA: Diagnosis not present

## 2020-09-22 MED ORDER — SIMVASTATIN 20 MG PO TABS
20.0000 mg | ORAL_TABLET | Freq: Every day | ORAL | 3 refills | Status: DC
Start: 2020-09-22 — End: 2021-09-21

## 2020-09-22 MED ORDER — MONTELUKAST SODIUM 10 MG PO TABS
ORAL_TABLET | ORAL | 3 refills | Status: DC
Start: 2020-09-22 — End: 2021-09-26

## 2020-09-22 MED ORDER — TADALAFIL 20 MG PO TABS: 20.0000 mg | ORAL_TABLET | Freq: Every day | ORAL | 11 refills | Status: DC | PRN

## 2020-09-22 MED ORDER — LISINOPRIL-HYDROCHLOROTHIAZIDE 10-12.5 MG PO TABS: ORAL_TABLET | ORAL | 3 refills | Status: DC

## 2020-09-22 MED ORDER — PANTOPRAZOLE SODIUM 40 MG PO TBEC
40.0000 mg | DELAYED_RELEASE_TABLET | Freq: Every day | ORAL | 3 refills | Status: DC
Start: 2020-09-22 — End: 2021-09-26

## 2020-09-22 NOTE — Progress Notes (Signed)
Subjective:    Patient ID: Marcus Grant, male    DOB: 06-30-1956, 64 y.o.   MRN: [id number]  HPI Here for a well exam. His only complaint is left knee pain which started about 2 and 1/2 months ago. No hx of trauma. No swelling or locking. The pain is anterior around the kneecap.    Review of Systems  Constitutional: Negative.   HENT: Negative.   Eyes: Negative.   Respiratory: Negative.   Cardiovascular: Negative.   Gastrointestinal: Negative.   Genitourinary: Negative.   Musculoskeletal: Positive for arthralgias.  Skin: Negative.   Neurological: Negative.   Psychiatric/Behavioral: Negative.        Objective:   Physical Exam Constitutional:      General: He is not in acute distress.    Appearance: He is well-developed and well-nourished. He is obese. He is not diaphoretic.  HENT:     Head: Normocephalic and atraumatic.     Right Ear: External ear normal.     Left Ear: External ear normal.     Nose: Nose normal.     Mouth/Throat:     Mouth: Oropharynx is clear and moist.     Pharynx: No oropharyngeal exudate.  Eyes:     General: No scleral icterus.       Right eye: No discharge.        Left eye: No discharge.     Extraocular Movements: EOM normal.     Conjunctiva/sclera: Conjunctivae normal.     Pupils: Pupils are equal, round, and reactive to light.  Neck:     Thyroid: No thyromegaly.     Vascular: No JVD.     Trachea: No tracheal deviation.  Cardiovascular:     Rate and Rhythm: Normal rate and regular rhythm.     Pulses: Intact distal pulses.     Heart sounds: Normal heart sounds. No murmur heard. No friction rub. No gallop.   Pulmonary:     Effort: Pulmonary effort is normal. No respiratory distress.     Breath sounds: Normal breath sounds. No wheezing or rales.  Chest:     Chest wall: No tenderness.  Abdominal:     General: Bowel sounds are normal. There is no distension.     Palpations: Abdomen is soft. There is no mass.     Tenderness: There  is no abdominal tenderness. There is no guarding or rebound.  Genitourinary:    Penis: Normal. No tenderness.      Testes: Normal.     Prostate: Normal.     Rectum: Normal. Guaiac result negative.  Musculoskeletal:        General: No tenderness or edema. Normal range of motion.     Cervical back: Neck supple.     Comments: The left knee appears normal, no swelling. No tenderness, ROM is full. There is crepitus around the patella.   Lymphadenopathy:     Cervical: No cervical adenopathy.  Skin:    General: Skin is warm and dry.     Coloration: Skin is not pale.     Findings: No erythema or rash.  Neurological:     Mental Status: He is alert and oriented to person, place, and time.     Cranial Nerves: No cranial nerve deficit.     Motor: No abnormal muscle tone.     Coordination: Coordination normal.     Deep Tendon Reflexes: Reflexes are normal and symmetric. Reflexes normal.  Psychiatric:        Mood  and Affect: Mood and affect normal.        Behavior: Behavior normal.        Thought Content: Thought content normal.        Judgment: Judgment normal.           Assessment & Plan:  Well exam. We discussed diet and exercise. Since his glucose was slightly elevated, we will check an A1c today. The left knee pain may be coming from a patello-femoral syndrome. Refer to Orthopedics. Alysia Penna, MD

## 2020-09-23 ENCOUNTER — Encounter: Payer: PRIVATE HEALTH INSURANCE | Admitting: Family Medicine

## 2020-09-23 LAB — HEMOGLOBIN A1C
Hgb A1c MFr Bld: 6 % of total Hgb — ABNORMAL HIGH (ref <5.7)
Mean Plasma Glucose: 126 mg/dL
eAG (mmol/L): 7 mmol/L

## 2020-10-20 ENCOUNTER — Other Ambulatory Visit: Payer: Self-pay

## 2020-10-20 ENCOUNTER — Ambulatory Visit (INDEPENDENT_AMBULATORY_CARE_PROVIDER_SITE_OTHER): Payer: PRIVATE HEALTH INSURANCE | Admitting: Orthopedic Surgery

## 2020-10-20 ENCOUNTER — Ambulatory Visit (INDEPENDENT_AMBULATORY_CARE_PROVIDER_SITE_OTHER): Payer: PRIVATE HEALTH INSURANCE

## 2020-10-20 DIAGNOSIS — M1712 Unilateral primary osteoarthritis, left knee: Secondary | ICD-10-CM

## 2020-10-20 DIAGNOSIS — M25562 Pain in left knee: Secondary | ICD-10-CM

## 2020-10-20 DIAGNOSIS — M79672 Pain in left foot: Secondary | ICD-10-CM

## 2020-10-20 MED ORDER — MELOXICAM 15 MG PO TABS: ORAL_TABLET | ORAL | 0 refills | Status: DC

## 2020-10-24 ENCOUNTER — Encounter: Payer: Self-pay | Admitting: Orthopedic Surgery

## 2020-10-24 NOTE — Progress Notes (Signed)
Office Visit Note   Patient: Marcus Grant           Date of Birth: [birth year]           MRN: [id number] Visit Date: 10/20/2020 Requested by: Laurey Morale, MD [place],  [place] [address] PCP: Laurey Morale, MD  Subjective: Chief Complaint  Patient presents with   Left Knee - Pain   Left Foot - Pain    HPI: Marcus Grant is a 65 y.o. male who presents to the office complaining of left knee pain and left foot pain.  Patient notes left knee pain for about 3 months.  He cannot recall specific injury but does state that he developed difficulty weightbearing for 4 days in October that has since significantly improved.  He notes posterior left knee pain as well as medial sided left knee pain.  This pain comes and goes in intensity and is not currently causing significant pain today.  He denies any instability or mechanical symptoms.  He also complains of left foot pain that he localizes to the lateral left foot.  His job involves a lot of standing at work.  He has had left foot pain since fall.  No known injury.  No numbness or tingling.  Denies any swelling or bruising.  He takes occasional aspirin for pain control.  He recently got new shoes that he states has helped his left foot pain significantly.  He has no history of significant issue with his left foot or left knee prior to these episodes.  Pain does not wake him up at night from either source.  He enjoys golfing and antiquing in his free time..                ROS: All systems reviewed are negative as they relate to the chief complaint within the history of present illness.  Patient denies fevers or chills.  Assessment & Plan: Visit Diagnoses:  1. Pain in left foot   2. Primary osteoarthritis of left knee     Plan: Patient is a 65 year old male who presents complaining of left knee pain and left foot pain.  He has had about 3 months of pain in his foot and knee without specific injury.  Majority of  the pain in the left knee is medial and posterior while the left foot pain is centered around the fifth metatarsal base and the insertion of the peroneal tendon.  Discussed options available to patient.  Radiographs taken today are negative for any acute pathology to explain his pain; he does have mild joint space narrowing of the left knee medial compartment compared with the right knee.  After discussion of options, plan to prescribe meloxicam for symptomatic relief and administered left knee injection with cortisone.  Patient tolerated the procedure well.  Follow-up in 6 weeks for clinical recheck.  Decide for or against MRI scan of the left foot and left knee at that time based on how his pain responds to the injection and NSAID.  Follow-Up Instructions: No follow-ups on file.   Orders:  Orders Placed This Encounter  Procedures   XR Foot Complete Left   XR KNEE 3 VIEW LEFT   Meds ordered this encounter  Medications   meloxicam (MOBIC) 15 MG tablet    Sig: Take one tablet daily for 2 weeks and then take once daily as needed for two weeks    Dispense:  30 tablet    Refill:  0      Procedures: Large Joint Inj: L knee on 10/28/2020 7:34 AM Indications: diagnostic evaluation, joint swelling and pain Details: 18 G 1.5 in needle, superolateral approach  Arthrogram: No  Medications: 5 mL lidocaine 1 %; 40 mg methylPREDNISolone acetate 40 MG/ML; 4 mL bupivacaine 0.25 % Outcome: tolerated well, no immediate complications Procedure, treatment alternatives, risks and benefits explained, specific risks discussed. Consent was given by the patient. Immediately prior to procedure a time out was called to verify the correct patient, procedure, equipment, support staff and site/side marked as required. Patient was prepped and draped in the usual sterile fashion.       Clinical Data: No additional findings.  Objective: Vital Signs: There were no vitals taken for this visit.  Physical Exam:   Constitutional: Patient appears well-developed HEENT:  Head: Normocephalic Eyes:EOM are normal Neck: Normal range of motion Cardiovascular: Normal rate Pulmonary/chest: Effort normal Neurologic: Patient is alert Skin: Skin is warm Psychiatric: Patient has normal mood and affect  Ortho Exam: Ortho exam demonstrates left knee with no effusion.  Tenderness over the medial joint line and the pes anserine bursa as well as knee hamstring tendons posteriorly.  Able to perform straight leg raise.  No tenderness over the patella.  No tenderness over the lateral joint line.  Left foot with intact dorsiflexion, plantarflexion, inversion, eversion.  No tenderness over the medial/lateral malleoli.  Achilles tendon intact by palpation.  Anterior tibialis tendon intact by palpation.  Tenderness over the fifth metatarsal base moderately.  Mild worsening of pain with active/resisted eversion.  No calf tenderness.  No tenderness throughout the plantar fascia.  Specialty Comments:  No specialty comments available.  Imaging: No results found.   PMFS History: Patient Active Problem List   Diagnosis Date Noted   ERECTILE DYSFUNCTION 12/04/2008   Dyslipidemia 11/22/2007   Essential hypertension 11/22/2007   ASTHMA 11/22/2007   GERD 11/22/2007   DIVERTICULOSIS, COLON 11/22/2007   COLONIC POLYPS, HX OF 11/22/2007   Past Medical History:  Diagnosis Date   Asthma    Colon polyps    Diverticulosis    GERD (gastroesophageal reflux disease)    Hyperlipidemia    Hypertension     Family History  Problem Relation Age of Onset   Dementia Mother    Coronary artery disease Other    Colon polyps Other    Colon cancer Neg Hx    Esophageal cancer Neg Hx    Stomach cancer Neg Hx    Rectal cancer Neg Hx     Past Surgical History:  Procedure Laterality Date   COLONOSCOPY  05/04/2017   per Dr.  Fuller Plan, adenomatous polyps, repeat in 5 yrs    RETINAL DETACHMENT REPAIR W/ SCLERAL  BUCKLE LE  2012   per Dr. Starling Manns in [place] History   Occupational History   Not on file  Tobacco Use   Smoking status: Never Smoker   Smokeless tobacco: Never Used  Vaping Use   Vaping Use: Never used  Substance and Sexual Activity   Alcohol use: Yes    Alcohol/week: 2.0 standard drinks    Types: 2 Shots of liquor per week    Comment: 2 drinks weekly   Drug use: No   Sexual activity: Not on file

## 2020-10-28 ENCOUNTER — Encounter: Payer: Self-pay | Admitting: Orthopedic Surgery

## 2020-10-28 DIAGNOSIS — M1712 Unilateral primary osteoarthritis, left knee: Secondary | ICD-10-CM

## 2020-10-28 MED ORDER — METHYLPREDNISOLONE ACETATE 40 MG/ML IJ SUSP
40.0000 mg | INTRAMUSCULAR | Status: AC | PRN
  Administered 2020-10-28: 40 mg via INTRA_ARTICULAR

## 2020-10-28 MED ORDER — BUPIVACAINE HCL 0.25 % IJ SOLN
4.0000 mL | INTRAMUSCULAR | Status: AC | PRN
  Administered 2020-10-28: 4 mL via INTRA_ARTICULAR

## 2020-10-28 MED ORDER — LIDOCAINE HCL 1 % IJ SOLN
5.0000 mL | INTRAMUSCULAR | Status: AC | PRN
  Administered 2020-10-28: 5 mL

## 2020-12-01 ENCOUNTER — Ambulatory Visit (INDEPENDENT_AMBULATORY_CARE_PROVIDER_SITE_OTHER): Payer: PRIVATE HEALTH INSURANCE | Admitting: Orthopedic Surgery

## 2020-12-01 DIAGNOSIS — M79672 Pain in left foot: Secondary | ICD-10-CM

## 2020-12-05 ENCOUNTER — Encounter: Payer: Self-pay | Admitting: Orthopedic Surgery

## 2020-12-05 NOTE — Progress Notes (Signed)
Office Visit Note   Patient: Marcus Grant           Date of Birth: 20-Mar-1956           MRN: [id number] Visit Date: 12/01/2020 Requested by: Laurey Morale, MD [place],  [place] [address] PCP: Laurey Morale, MD  Subjective: Chief Complaint  Patient presents with  . Left Foot - Pain  . Left Knee - Pain    HPI: [name] is a 65 year old patient with left knee and left foot pain.  He had very good relief with the cortisone injection into the left knee done last clinic visit.  Those notes are reviewed.  Denies any more popping.  Overall the pain is better.  Not really taking medication for that because he did not have to.  He however he is reporting more pain on the metatarsal shaft #5 of the left foot.  No history of vitamin D problems.  Works at [employer] for [employer].  He has to do a lot of standing.  He does report pain with weightbearing on the left foot and he localizes it to the Jones fracture region of the metatarsal.    ROS: All systems reviewed are negative as they relate to the chief complaint within the history of present illness.  Patient denies  fevers or chills.   Assessment & Plan: Visit Diagnoses:  1. Pain in left foot     Plan: Impression is left foot pain with possible fifth metatarsal stress fracture or reaction.  Plain radiographs unremarkable.  Plan for MRI scan left foot due to duration of symptoms over 6 weeks and no relief with conservative measures.  He has tried activity modification and over-the-counter medication as well as a period of rest without relief.  Follow-up after that study.  His left knee is doing well and requires no further intervention at this time  Follow-Up Instructions: Return for after MRI.   Orders:  Orders Placed This Encounter  Procedures  . MR Foot Left w/o contrast   No orders of the defined types were placed in this encounter.     Procedures: No procedures performed   Clinical Data: No  additional findings.  Objective: Vital Signs: There were no vitals taken for this visit.  Physical Exam:   Constitutional: Patient appears well-developed HEENT:  Head: Normocephalic Eyes:EOM are normal Neck: Normal range of motion Cardiovascular: Normal rate Pulmonary/chest: Effort normal Neurologic: Patient is alert Skin: Skin is warm Psychiatric: Patient has normal mood and affect    Ortho Exam: Ortho exam demonstrates no effusion in the left knee.  Collateral and cruciate ligaments are stable.  Extensor mechanism is intact.  Range of motion is the same as last clinic visit.  No other masses lymphadenopathy or skin changes noted in that left knee region.  Left foot is examined.  No real tenderness to palpation over the peroneus brevis tendon.  Ankle dorsiflexion plantarflexion intact.  He has palpable pedal pulses.  No pain with pronation supination of the foot.  Does have tenderness to palpation at the region of the fifth metatarsal base.  No focal swelling in this area.  Specialty Comments:  No specialty comments available.  Imaging: No results found.   PMFS History: Patient Active Problem List   Diagnosis Date Noted  . ERECTILE DYSFUNCTION 12/04/2008  . Dyslipidemia 11/22/2007  . Essential hypertension 11/22/2007  . ASTHMA 11/22/2007  . GERD 11/22/2007  . DIVERTICULOSIS, COLON 11/22/2007  . COLONIC  POLYPS, HX OF 11/22/2007   Past Medical History:  Diagnosis Date  . Asthma   . Colon polyps   . Diverticulosis   . GERD (gastroesophageal reflux disease)   . Hyperlipidemia   . Hypertension     Family History  Problem Relation Age of Onset  . Dementia Mother   . Coronary artery disease Other   . Colon polyps Other   . Colon cancer Neg Hx   . Esophageal cancer Neg Hx   . Stomach cancer Neg Hx   . Rectal cancer Neg Hx     Past Surgical History:  Procedure Laterality Date  . COLONOSCOPY  05/04/2017   per Dr.  Fuller Plan, adenomatous polyps, repeat in 5 yrs   .  RETINAL DETACHMENT REPAIR W/ SCLERAL BUCKLE LE  2012   per Dr. Starling Manns in [place] History  . Not on file  Tobacco Use  . Smoking status: Never Smoker  . Smokeless tobacco: Never Used  Vaping Use  . Vaping Use: Never used  Substance and Sexual Activity  . Alcohol use: Yes    Alcohol/week: 2.0 standard drinks    Types: 2 Shots of liquor per week    Comment: 2 drinks weekly  . Drug use: No  . Sexual activity: Not on file

## 2021-02-15 ENCOUNTER — Telehealth: Payer: Self-pay | Admitting: Family Medicine

## 2021-02-15 NOTE — Telephone Encounter (Signed)
Pt is calling in to get motion sick patches for a trip that he will be going on 03/07/2021 and will be gone for 10 days.  Pharm:  [facility] on [address]

## 2021-02-15 NOTE — Telephone Encounter (Signed)
Pharmacy is updated.

## 2021-02-16 ENCOUNTER — Other Ambulatory Visit: Payer: Self-pay

## 2021-02-16 MED ORDER — SCOPOLAMINE 1 MG/3DAYS TD PT72: 1.0000 | MEDICATED_PATCH | TRANSDERMAL | 1 refills | Status: DC

## 2021-02-16 NOTE — Telephone Encounter (Signed)
Call in Transdermal scopolamine patches to place one every 3 days, a box of 10

## 2021-02-16 NOTE — Telephone Encounter (Addendum)
Patient aware transdermal patches has been sent to [facility].

## 2021-08-23 ENCOUNTER — Other Ambulatory Visit: Payer: Self-pay

## 2021-08-23 MED ORDER — LISINOPRIL-HYDROCHLOROTHIAZIDE 10-12.5 MG PO TABS: ORAL_TABLET | ORAL | 0 refills | Status: DC

## 2021-09-21 ENCOUNTER — Other Ambulatory Visit: Payer: Self-pay | Admitting: Family Medicine

## 2021-09-26 ENCOUNTER — Encounter: Payer: Self-pay | Admitting: Family Medicine

## 2021-09-26 ENCOUNTER — Ambulatory Visit (INDEPENDENT_AMBULATORY_CARE_PROVIDER_SITE_OTHER): Payer: PRIVATE HEALTH INSURANCE | Admitting: Family Medicine

## 2021-09-26 VITALS — BP 112/80 | HR 85 | Temp 98.7°F | Ht 68.25 in | Wt 213.0 lb

## 2021-09-26 DIAGNOSIS — Z0000 Encounter for general adult medical examination without abnormal findings: Secondary | ICD-10-CM | POA: Diagnosis not present

## 2021-09-26 DIAGNOSIS — Z23 Encounter for immunization: Secondary | ICD-10-CM

## 2021-09-26 LAB — BASIC METABOLIC PANEL
BUN: 16 mg/dL (ref 6–23)
CO2: 30 mEq/L (ref 19–32)
Calcium: 9.8 mg/dL (ref 8.4–10.5)
Chloride: 99 mEq/L (ref 96–112)
Creatinine, Ser: 0.88 mg/dL (ref 0.40–1.50)
GFR: 91.21 mL/min (ref >60.00)
Glucose, Bld: 107 mg/dL — ABNORMAL HIGH (ref 70–99)
Potassium: 4.6 mEq/L (ref 3.5–5.1)
Sodium: 138 mEq/L (ref 135–145)

## 2021-09-26 LAB — CBC WITH DIFFERENTIAL/PLATELET
Basophils Absolute: 0 10*3/uL (ref 0.0–0.1)
Basophils Relative: 0.6 % (ref 0.0–3.0)
Eosinophils Absolute: 0.1 10*3/uL (ref 0.0–0.7)
Eosinophils Relative: 2.4 % (ref 0.0–5.0)
HCT: 47 % (ref 39.0–52.0)
Hemoglobin: 15.9 g/dL (ref 13.0–17.0)
Lymphocytes Relative: 25.5 % (ref 12.0–46.0)
Lymphs Abs: 1.4 10*3/uL (ref 0.7–4.0)
MCHC: 33.9 g/dL (ref 30.0–36.0)
MCV: 88.3 fl (ref 78.0–100.0)
Monocytes Absolute: 0.5 10*3/uL (ref 0.1–1.0)
Monocytes Relative: 8.5 % (ref 3.0–12.0)
Neutro Abs: 3.6 10*3/uL (ref 1.4–7.7)
Neutrophils Relative %: 63 % (ref 43.0–77.0)
Platelets: 260 10*3/uL (ref 150.0–400.0)
RBC: 5.33 Mil/uL (ref 4.22–5.81)
RDW: 13.4 % (ref 11.5–15.5)
WBC: 5.7 10*3/uL (ref 4.0–10.5)

## 2021-09-26 LAB — HEMOGLOBIN A1C: Hgb A1c MFr Bld: 6.4 % (ref 4.6–6.5)

## 2021-09-26 LAB — HEPATIC FUNCTION PANEL
ALT: 18 U/L (ref 0–53)
AST: 17 U/L (ref 0–37)
Albumin: 4.3 g/dL (ref 3.5–5.2)
Alkaline Phosphatase: 57 U/L (ref 39–117)
Bilirubin, Direct: 0.1 mg/dL (ref 0.0–0.3)
Total Bilirubin: 0.8 mg/dL (ref 0.2–1.2)
Total Protein: 7.1 g/dL (ref 6.0–8.3)

## 2021-09-26 LAB — PSA: PSA: 0.97 ng/mL (ref 0.10–4.00)

## 2021-09-26 LAB — LIPID PANEL
Cholesterol: 153 mg/dL (ref 0–200)
HDL: 50 mg/dL (ref >39.00)
LDL Cholesterol: 75 mg/dL (ref 0–99)
NonHDL: 103.04
Total CHOL/HDL Ratio: 3
Triglycerides: 138 mg/dL (ref 0.0–149.0)
VLDL: 27.6 mg/dL (ref 0.0–40.0)

## 2021-09-26 LAB — TSH: TSH: 1.55 u[IU]/mL (ref 0.35–5.50)

## 2021-09-26 MED ORDER — MELOXICAM 15 MG PO TABS: ORAL_TABLET | ORAL | 3 refills | Status: DC

## 2021-09-26 MED ORDER — SIMVASTATIN 20 MG PO TABS: 20.0000 mg | ORAL_TABLET | Freq: Every day | ORAL | 3 refills | Status: DC

## 2021-09-26 MED ORDER — TADALAFIL 20 MG PO TABS: 20.0000 mg | ORAL_TABLET | Freq: Every day | ORAL | 11 refills | Status: DC | PRN

## 2021-09-26 MED ORDER — LISINOPRIL-HYDROCHLOROTHIAZIDE 10-12.5 MG PO TABS: ORAL_TABLET | ORAL | 3 refills | Status: DC

## 2021-09-26 MED ORDER — MONTELUKAST SODIUM 10 MG PO TABS: ORAL_TABLET | ORAL | 3 refills | Status: DC

## 2021-09-26 MED ORDER — PANTOPRAZOLE SODIUM 40 MG PO TBEC: 40.0000 mg | DELAYED_RELEASE_TABLET | Freq: Every day | ORAL | 3 refills | Status: DC

## 2021-09-26 NOTE — Addendum Note (Signed)
Addended by: Wyvonne Lenz on: 09/26/2021 08:42 AM   Modules accepted: Orders

## 2021-09-26 NOTE — Progress Notes (Signed)
° °  Subjective:    Patient ID: Marcus Grant, male    DOB: 1956/05/21, 65 y.o.   MRN: [id number]  HPI Here for a well exam. He feels well.   Review of Systems  Constitutional: Negative.   HENT: Negative.    Eyes: Negative.   Respiratory: Negative.    Cardiovascular: Negative.   Gastrointestinal: Negative.   Genitourinary: Negative.   Musculoskeletal: Negative.   Skin: Negative.   Neurological: Negative.   Psychiatric/Behavioral: Negative.        Objective:   Physical Exam Constitutional:      General: He is not in acute distress.    Appearance: Normal appearance. He is well-developed. He is not diaphoretic.  HENT:     Head: Normocephalic and atraumatic.     Right Ear: External ear normal.     Left Ear: External ear normal.     Nose: Nose normal.     Mouth/Throat:     Pharynx: No oropharyngeal exudate.  Eyes:     General: No scleral icterus.       Right eye: No discharge.        Left eye: No discharge.     Conjunctiva/sclera: Conjunctivae normal.     Pupils: Pupils are equal, round, and reactive to light.  Neck:     Thyroid: No thyromegaly.     Vascular: No JVD.     Trachea: No tracheal deviation.  Cardiovascular:     Rate and Rhythm: Normal rate and regular rhythm.     Heart sounds: Normal heart sounds. No murmur heard.   No friction rub. No gallop.  Pulmonary:     Effort: Pulmonary effort is normal. No respiratory distress.     Breath sounds: Normal breath sounds. No wheezing or rales.  Chest:     Chest wall: No tenderness.  Abdominal:     General: Bowel sounds are normal. There is no distension.     Palpations: Abdomen is soft. There is no mass.     Tenderness: There is no abdominal tenderness. There is no guarding or rebound.  Genitourinary:    Penis: Normal. No tenderness.      Testes: Normal.     Prostate: Normal.     Rectum: Normal. Guaiac result negative.  Musculoskeletal:        General: No tenderness. Normal range of motion.     Cervical  back: Neck supple.  Lymphadenopathy:     Cervical: No cervical adenopathy.  Skin:    General: Skin is warm and dry.     Coloration: Skin is not pale.     Findings: No erythema or rash.  Neurological:     Mental Status: He is alert and oriented to person, place, and time.     Cranial Nerves: No cranial nerve deficit.     Motor: No abnormal muscle tone.     Coordination: Coordination normal.     Deep Tendon Reflexes: Reflexes are normal and symmetric. Reflexes normal.  Psychiatric:        Behavior: Behavior normal.        Thought Content: Thought content normal.        Judgment: Judgment normal.          Assessment & Plan:  Well exam. We discussed diet and exercise. Get fasting labs. Alysia Penna, MD

## 2021-09-28 ENCOUNTER — Other Ambulatory Visit: Payer: Self-pay

## 2021-09-28 DIAGNOSIS — R739 Hyperglycemia, unspecified: Secondary | ICD-10-CM

## 2022-02-12 ENCOUNTER — Emergency Department (HOSPITAL_BASED_OUTPATIENT_CLINIC_OR_DEPARTMENT_OTHER)
Admission: EM | Admit: 2022-02-12 | Discharge: 2022-02-12 | Disposition: A | Discharge status: Home / Self Care | Payer: 59 | Attending: Emergency Medicine | Admitting: Emergency Medicine

## 2022-02-12 ENCOUNTER — Emergency Department (HOSPITAL_BASED_OUTPATIENT_CLINIC_OR_DEPARTMENT_OTHER): Payer: 59

## 2022-02-12 ENCOUNTER — Encounter (HOSPITAL_BASED_OUTPATIENT_CLINIC_OR_DEPARTMENT_OTHER): Payer: Self-pay

## 2022-02-12 ENCOUNTER — Other Ambulatory Visit: Payer: Self-pay

## 2022-02-12 DIAGNOSIS — M25561 Pain in right knee: Secondary | ICD-10-CM

## 2022-02-12 DIAGNOSIS — X501XXA Overexertion from prolonged static or awkward postures, initial encounter: Secondary | ICD-10-CM | POA: Insufficient documentation

## 2022-02-12 DIAGNOSIS — Y92007 Garden or yard of unspecified non-institutional (private) residence as the place of occurrence of the external cause: Secondary | ICD-10-CM | POA: Diagnosis not present

## 2022-02-12 MED ORDER — OXYCODONE-ACETAMINOPHEN 5-325 MG PO TABS
1.0000 | ORAL_TABLET | Freq: Once | ORAL | Status: AC
  Administered 2022-02-12: 1 via ORAL
  Filled 2022-02-12: qty 1

## 2022-02-12 MED ORDER — OXYCODONE-ACETAMINOPHEN 5-325 MG PO TABS: 1.0000 | ORAL_TABLET | Freq: Four times a day (QID) | ORAL | 0 refills | Status: DC | PRN

## 2022-02-12 NOTE — ED Triage Notes (Signed)
Pt presents with a 2 week hx of R knee pain that was exacerbated after he twisted it in the yard today.  ?

## 2022-02-12 NOTE — ED Provider Notes (Signed)
?[facility] ?Provider Note ? ? ?CSN: [id number] ?Arrival date & time: 02/12/22  1430 ? ?  ? ?History ? ?Chief Complaint  ?Patient presents with  ? Knee Pain  ? ? ?Marcus Grant is a 66 y.o. male. ? ?Chief complaint of 2 weeks of right-sided knee pain.  He states he had a generalized mild ache in the right knee for 2 weeks.  And then today he was in the yard when he twisted the right knee exacerbating the pain.  Denies fall or other trauma.  Complaining of sharp pain to the right knee radiating up to the lateral aspect of the right thigh.  Denies any new numbness or weakness.  Denies actual fall or head injury or neck or back pain.  Denies fevers cough vomiting or diarrhea.  He took meloxicam at home without improvement and presents to the ER. ? ? ?  ? ?Home Medications ?Prior to Admission medications   ?Medication Sig Start Date End Date Taking? Authorizing Provider  ?lisinopril-hydrochlorothiazide (ZESTORETIC) 10-12.5 MG tablet TAKE 1 TABLET BY MOUTH DAILY (MAX ON INS) 09/26/21   Laurey Morale, MD  ?meloxicam (MOBIC) 15 MG tablet Take one tablet daily for 2 weeks and then take once daily as needed for two weeks 09/26/21   Laurey Morale, MD  ?montelukast (SINGULAIR) 10 MG tablet TAKE 1 TABLET AT BEDTIME BY MOUTH 09/26/21   Laurey Morale, MD  ?pantoprazole (PROTONIX) 40 MG tablet Take 1 tablet (40 mg total) by mouth daily. 09/26/21   Laurey Morale, MD  ?simvastatin (ZOCOR) 20 MG tablet Take 1 tablet (20 mg total) by mouth daily. 09/26/21   Laurey Morale, MD  ?tadalafil (CIALIS) 20 MG tablet Take 1 tablet (20 mg total) by mouth daily as needed for erectile dysfunction. 09/26/21   Laurey Morale, MD  ?   ? ?Allergies    ?Patient has no known allergies.   ? ?Review of Systems   ?Review of Systems  ?Constitutional:  Negative for fever.  ?HENT:  Negative for ear pain and sore throat.   ?Eyes:  Negative for pain.  ?Respiratory:  Negative for cough.   ?Cardiovascular:  Negative for  chest pain.  ?Gastrointestinal:  Negative for abdominal pain.  ?Genitourinary:  Negative for flank pain.  ?Musculoskeletal:  Negative for back pain.  ?Skin:  Negative for color change and rash.  ?Neurological:  Negative for syncope.  ?All other systems reviewed and are negative. ? ?Physical Exam ?Updated Vital Signs ?BP 122/78 (BP Location: Right Arm)   Pulse 79   Temp 98.6 ?F (37 ?C) (Oral)   Resp 16   Ht '5\' 9"'$  (1.753 m)   Wt 95.3 kg   SpO2 98%   BMI 31.01 kg/m?  ?Physical Exam ?Constitutional:   ?   Appearance: He is well-developed.  ?HENT:  ?   Head: Normocephalic.  ?   Nose: Nose normal.  ?Eyes:  ?   Extraocular Movements: Extraocular movements intact.  ?Cardiovascular:  ?   Rate and Rhythm: Normal rate.  ?Pulmonary:  ?   Effort: Pulmonary effort is normal.  ?Musculoskeletal:  ?   Comments: Moderate decreased range of motion of the right knee secondary to pain.  Mild joint effusion noted in the right knee.  No gross deformity, no cellulitis noted.  Mild tenderness along the right knee joint spaces.  Moderate tenderness tracking up the lateral aspect of the right thigh compartments otherwise soft and neurovascularly intact distally.  ?Skin: ?  Coloration: Skin is not jaundiced.  ?Neurological:  ?   Mental Status: He is alert. Mental status is at baseline.  ? ? ?ED Results / Procedures / Treatments   ?Labs ?(all labs ordered are listed, but only abnormal results are displayed) ?Labs Reviewed - No data to display ? ?EKG ?None ? ?Radiology ?DG Knee Complete 4 Views Right ? ?Result Date: 02/12/2022 ?CLINICAL DATA:  Two week history of right knee pain, worsened after twisting injury. EXAM: RIGHT KNEE - COMPLETE 4+ VIEW COMPARISON:  None FINDINGS: No visible joint effusion. No regional fracture. Mild medial compartment joint space narrowing. No other focal bone finding. IMPRESSION: Mild medial compartment joint space narrowing. No evidence of fracture or joint effusion. Electronically Signed   By: Nelson Chimes  M.D.   On: 02/12/2022 15:22   ? ?Procedures ?Procedures  ? ? ?Medications Ordered in ED ?Medications  ?oxyCODONE-acetaminophen (PERCOCET/ROXICET) 5-325 MG per tablet 1 tablet (has no administration in time range)  ? ? ?ED Course/ Medical Decision Making/ A&P ?  ?                        ?Medical Decision Making ?Amount and/or Complexity of Data Reviewed ?Radiology: ordered. ? ?Risk ?Prescription drug management. ? ? ?History obtained from wife at bedside as well. ? ?Chart review shows outpatient visit December 2022 for preventive health reasons. ? ?Work-up includes x-rays of the right knee showing mild narrowing of the joint space but no significant pathology noted. ? ?Due to the patient's pain, placed in a knee immobilizer and crutch training provided.  Given Percocet for pain.  Advised outpatient follow-up with orthopedic surgery within 1 to 2 weeks advised immediate return for worsening pain fevers or any additional concerns. ? ? ? ? ? ? ? ?Final Clinical Impression(s) / ED Diagnoses ?Final diagnoses:  ?Acute pain of right knee  ? ? ?Rx / DC Orders ?ED Discharge Orders   ? ? None  ? ?  ? ? ?  ?Luna Fuse, MD ?02/12/22 1538 ? ?

## 2022-02-12 NOTE — Discharge Instructions (Signed)
Your x-ray did not show any fracture.  You have mild narrowing of the joint space noted in the right knee. ? ?Follow-up with the orthopedic specialist in 1 or 2 weeks.  Return back to the ER if you have worsening pain fevers or any additional concerns.  Use the knee brace and crutches as needed for support.  You may take them off if your pain improves. ?

## 2022-03-29 ENCOUNTER — Ambulatory Visit (INDEPENDENT_AMBULATORY_CARE_PROVIDER_SITE_OTHER): Payer: 59 | Admitting: Surgical

## 2022-03-29 ENCOUNTER — Encounter: Payer: Self-pay | Admitting: Orthopedic Surgery

## 2022-03-29 DIAGNOSIS — M25461 Effusion, right knee: Secondary | ICD-10-CM | POA: Diagnosis not present

## 2022-03-29 MED ORDER — BUPIVACAINE HCL 0.25 % IJ SOLN
4.0000 mL | INTRAMUSCULAR | Status: AC | PRN
  Administered 2022-03-29: 4 mL via INTRA_ARTICULAR

## 2022-03-29 MED ORDER — METHYLPREDNISOLONE ACETATE 40 MG/ML IJ SUSP
40.0000 mg | INTRAMUSCULAR | Status: AC | PRN
  Administered 2022-03-29: 40 mg via INTRA_ARTICULAR

## 2022-03-29 MED ORDER — LIDOCAINE HCL 1 % IJ SOLN
5.0000 mL | INTRAMUSCULAR | Status: AC | PRN
  Administered 2022-03-29: 5 mL

## 2022-03-29 NOTE — Progress Notes (Signed)
Office Visit Note   Patient: Marcus Grant           Date of Birth: [birth year]           MRN: [id number] Visit Date: 03/29/2022 Requested by: Laurey Morale, MD [place],  [place] [address] PCP: Laurey Morale, MD  Subjective: Chief Complaint  Patient presents with   Right Knee - Pain    HPI: Marcus Grant is a 66 y.o. male who presents to the office complaining of right knee pain.  Patient states that he felt a pull in his knee on the lateral aspect of the months ago and then after the that calm down, he stepped in a hole, torquing his right knee.  He was seen at urgent care in early May with radiographs at that time that were negative for any acute injury.  Since aggravating his knee, he has noticed increased popping sensation, catching, stiffness with immobility such as in the morning and with sitting for long periods of time.  He had to use crutches for a couple days after he initially torqued his knee.  Most of the pain is in the anterior and anterior medial aspect of the right knee.  No history of prior difficulty with the right knee up until this point.  He does have history of left knee injection with good relief that he had in early 2022 and he has had no recurrence of his symptoms in the left knee.  No history of gout.  Has taken Advil and Tylenol with some relief.  No history of diabetes..                ROS: All systems reviewed are negative as they relate to the chief complaint within the history of present illness.  Patient denies fevers or chills.  Assessment & Plan: Visit Diagnoses:  1. Effusion, right knee     Plan: Patient is a 66 year old male who presents for evaluation of right knee pain.  He has history of right knee pain mostly since torquing his knee after stepping in a hole several months ago.  Was seen in urgent care with radiographs reviewed they demonstrate no significant injury.  Does have a little bit of early arthritis on the  radiographs.  On exam, he does have effusion and tenderness mostly over the medial joint line.  After discussion of options, he would like to try right knee aspiration and injection as injection previously gave him excellent relief for his left knee pain last year.  About 10 cc of blood-tinged nonpurulent synovial fluid was aspirated and right knee was injected with cortisone.  He tolerated this injection well.  Plan for him to follow-up with the office in 6 weeks for clinical recheck.  Neck step would be MRI scan if he has continued pain or recurrence of effusion.  Follow-Up Instructions: No follow-ups on file.   Orders:  No orders of the defined types were placed in this encounter.  No orders of the defined types were placed in this encounter.     Procedures: Large Joint Inj: R knee on 03/29/2022 8:48 PM Indications: diagnostic evaluation, joint swelling and pain Details: 18 G 1.5 in needle, superolateral approach  Arthrogram: No  Medications: 5 mL lidocaine 1 %; 40 mg methylPREDNISolone acetate 40 MG/ML; 4 mL bupivacaine 0.25 % Aspirate: 10 mL blood-tinged Outcome: tolerated well, no immediate complications Procedure, treatment alternatives, risks and benefits explained, specific risks discussed. Consent was given by  the patient. Immediately prior to procedure a time out was called to verify the correct patient, procedure, equipment, support staff and site/side marked as required. Patient was prepped and draped in the usual sterile fashion.       Clinical Data: No additional findings.  Objective: Vital Signs: There were no vitals taken for this visit.  Physical Exam:  Constitutional: Patient appears well-developed HEENT:  Head: Normocephalic Eyes:EOM are normal Neck: Normal range of motion Cardiovascular: Normal rate Pulmonary/chest: Effort normal Neurologic: Patient is alert Skin: Skin is warm Psychiatric: Patient has normal mood and affect  Ortho Exam: Ortho exam  demonstrates right knee with 3 degrees extension compared with 0 degrees extension in the contralateral knee.  Positive effusion noted.  No warmth.  No tenderness over the prepatellar bursa.  No tenderness over the lateral joint line with moderate tenderness over the medial joint line.  Mild patellofemoral crepitus.  Calf tenderness.  Negative Homans' sign.  Patient is able to perform straight leg raise without extensor lag.  No pain with hip range of motion.  Stable to varus valgus stress at 0 and 30 degrees.  Stable to anterior posterior drawer at 90 degrees.  Specialty Comments:  No specialty comments available.  Imaging: No results found.   PMFS History: Patient Active Problem List   Diagnosis Date Noted   ERECTILE DYSFUNCTION 12/04/2008   Dyslipidemia 11/22/2007   Essential hypertension 11/22/2007   ASTHMA 11/22/2007   GERD 11/22/2007   DIVERTICULOSIS, COLON 11/22/2007   COLONIC POLYPS, HX OF 11/22/2007   Past Medical History:  Diagnosis Date   Asthma    Colon polyps    Diverticulosis    GERD (gastroesophageal reflux disease)    Hyperlipidemia    Hypertension     Family History  Problem Relation Age of Onset   Dementia Mother    Coronary artery disease Other    Colon polyps Other    Colon cancer Neg Hx    Esophageal cancer Neg Hx    Stomach cancer Neg Hx    Rectal cancer Neg Hx     Past Surgical History:  Procedure Laterality Date   COLONOSCOPY  05/04/2017   per Dr.  Fuller Plan, adenomatous polyps, repeat in 5 yrs    RETINAL DETACHMENT REPAIR W/ SCLERAL BUCKLE LE  2012   per Dr. Starling Manns in [place] History   Occupational History   Not on file  Tobacco Use   Smoking status: Never   Smokeless tobacco: Never  Vaping Use   Vaping Use: Never used  Substance and Sexual Activity   Alcohol use: Yes    Alcohol/week: 2.0 standard drinks of alcohol    Types: 2 Shots of liquor per week    Comment: 2 drinks weekly   Drug use: No   Sexual activity: Not  on file

## 2022-04-05 ENCOUNTER — Telehealth: Payer: Self-pay | Admitting: Family Medicine

## 2022-04-05 NOTE — Telephone Encounter (Signed)
Pt called to ask if he could have a different  prescription for the toenail fungus. He could not remember the name of that medication, but he stated the medication prescribed previously did not work.  Please advise.  [id number]- [place], [place] - [address] Phone:  [phone] Fax:  [phone]

## 2022-04-05 NOTE — Telephone Encounter (Signed)
Spoke with patient, he stated that the medication Lamisil that was written in the past did work, so he requested a new prescription.  Lamisil was last prescribed in 2016.       Informed patient that Dr. Sarajane Jews was out of the office this week and to schedule an appointment with any available provider for evaluation of toe nails.   He stated that he would call back at another time to schedule an appointment.

## 2022-05-10 ENCOUNTER — Ambulatory Visit (INDEPENDENT_AMBULATORY_CARE_PROVIDER_SITE_OTHER): Payer: 59 | Admitting: Surgical

## 2022-05-10 ENCOUNTER — Encounter: Payer: Self-pay | Admitting: Orthopedic Surgery

## 2022-05-10 DIAGNOSIS — M25461 Effusion, right knee: Secondary | ICD-10-CM

## 2022-05-10 NOTE — Progress Notes (Signed)
Office Visit Note   Patient: Marcus Grant           Date of Birth: Jul 24, 1956           MRN: [id number] Visit Date: 05/10/2022 Requested by: Laurey Morale, MD [place],  [place] [address] PCP: Laurey Morale, MD  Subjective: Chief Complaint  Patient presents with   Right Knee - Pain    HPI: Marcus Grant is a 66 y.o. male who presents to the office for reevaluation of right knee pain.  Patient had cortisone injection about 6 weeks ago.  He states that he feels great without any complaints.  Feels that he had 100% relief from the injection.  Denies any significant swelling, ankle symptoms, pain.  Plan on going on a cruise next month.                ROS: All systems reviewed are negative as they relate to the chief complaint within the history of present illness.  Patient denies fevers or chills.  Assessment & Plan: Visit Diagnoses:  1. Effusion, right knee     Plan: Patient is a 66 y.o. male who returns for re-eval of right knee pain.  Last injection 6 weeks ago provided excellent relief of pain with no recurrence of symptoms.  No effusion today on exam. Plan for patient to follow-up as needed if symptoms return.    Follow-Up Instructions: No follow-ups on file.   Orders:  No orders of the defined types were placed in this encounter.  No orders of the defined types were placed in this encounter.     Procedures: No procedures performed   Clinical Data: No additional findings.  Objective: Vital Signs: There were no vitals taken for this visit.  Physical Exam:  Constitutional: Patient appears well-developed HEENT:  Head: Normocephalic Eyes:EOM are normal Neck: Normal range of motion Cardiovascular: Normal rate Pulmonary/chest: Effort normal Neurologic: Patient is alert Skin: Skin is warm Psychiatric: Patient has normal mood and affect  Ortho Exam: Ortho exam demonstrates right knee with no effusion.  No tenderness over the  medial or lateral joint lines.  Able to perform straight leg raise without extensor lag.  0 degrees extension and greater than 120 knee flexion.  No calf tenderness.  Negative Homans' sign.  No pain with hip range of motion.  No cellulitis or skin changes noted.  Specialty Comments:  No specialty comments available.  Imaging: No results found.   PMFS History: Patient Active Problem List   Diagnosis Date Noted   ERECTILE DYSFUNCTION 12/04/2008   Dyslipidemia 11/22/2007   Essential hypertension 11/22/2007   ASTHMA 11/22/2007   GERD 11/22/2007   DIVERTICULOSIS, COLON 11/22/2007   COLONIC POLYPS, HX OF 11/22/2007   Past Medical History:  Diagnosis Date   Asthma    Colon polyps    Diverticulosis    GERD (gastroesophageal reflux disease)    Hyperlipidemia    Hypertension     Family History  Problem Relation Age of Onset   Dementia Mother    Coronary artery disease Other    Colon polyps Other    Colon cancer Neg Hx    Esophageal cancer Neg Hx    Stomach cancer Neg Hx    Rectal cancer Neg Hx     Past Surgical History:  Procedure Laterality Date   COLONOSCOPY  05/04/2017   per Dr.  Fuller Plan, adenomatous polyps, repeat in 5 yrs    RETINAL DETACHMENT REPAIR W/ SCLERAL  BUCKLE LE  2012   per Dr. Starling Manns in [place]   Occupational History   Not on file  Tobacco Use   Smoking status: Never   Smokeless tobacco: Never  Vaping Use   Vaping Use: Never used  Substance and Sexual Activity   Alcohol use: Yes    Alcohol/week: 2.0 standard drinks of alcohol    Types: 2 Shots of liquor per week    Comment: 2 drinks weekly   Drug use: No   Sexual activity: Not on file

## 2022-05-17 ENCOUNTER — Encounter: Payer: Self-pay | Admitting: Gastroenterology

## 2022-05-29 ENCOUNTER — Encounter: Payer: Self-pay | Admitting: Gastroenterology

## 2022-06-03 IMAGING — DX DG KNEE COMPLETE 4+V*R*
4 series · 4 of 4 positions shown · non-contrast
Comparison: None

CLINICAL DATA: Two week history of right knee pain, worsened after
twisting injury.

EXAM:
RIGHT KNEE - COMPLETE 4+ VIEW

[knee ap]
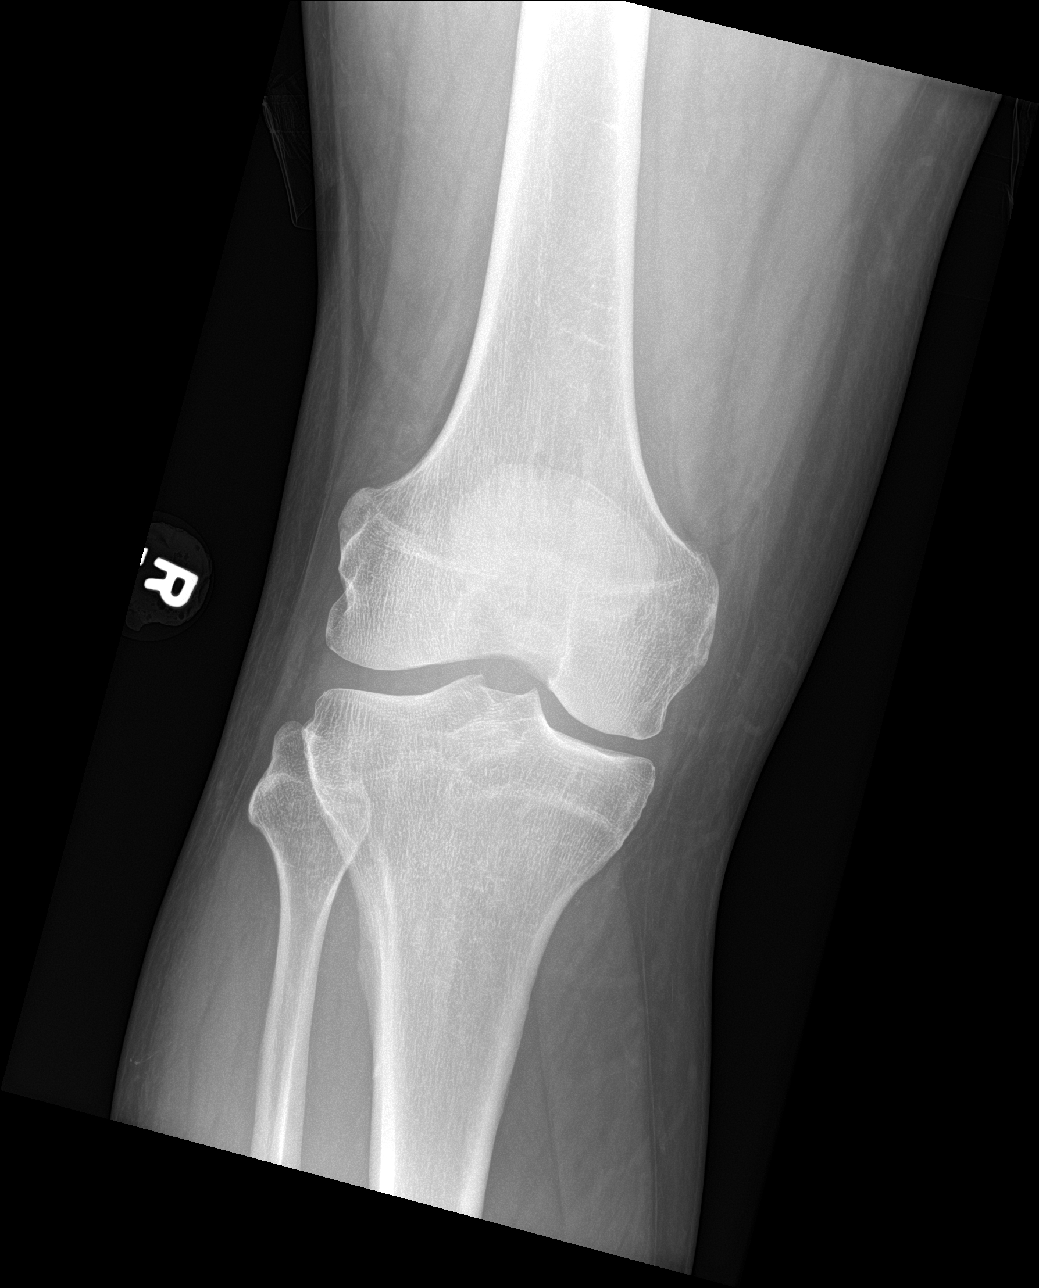

[knee lat]
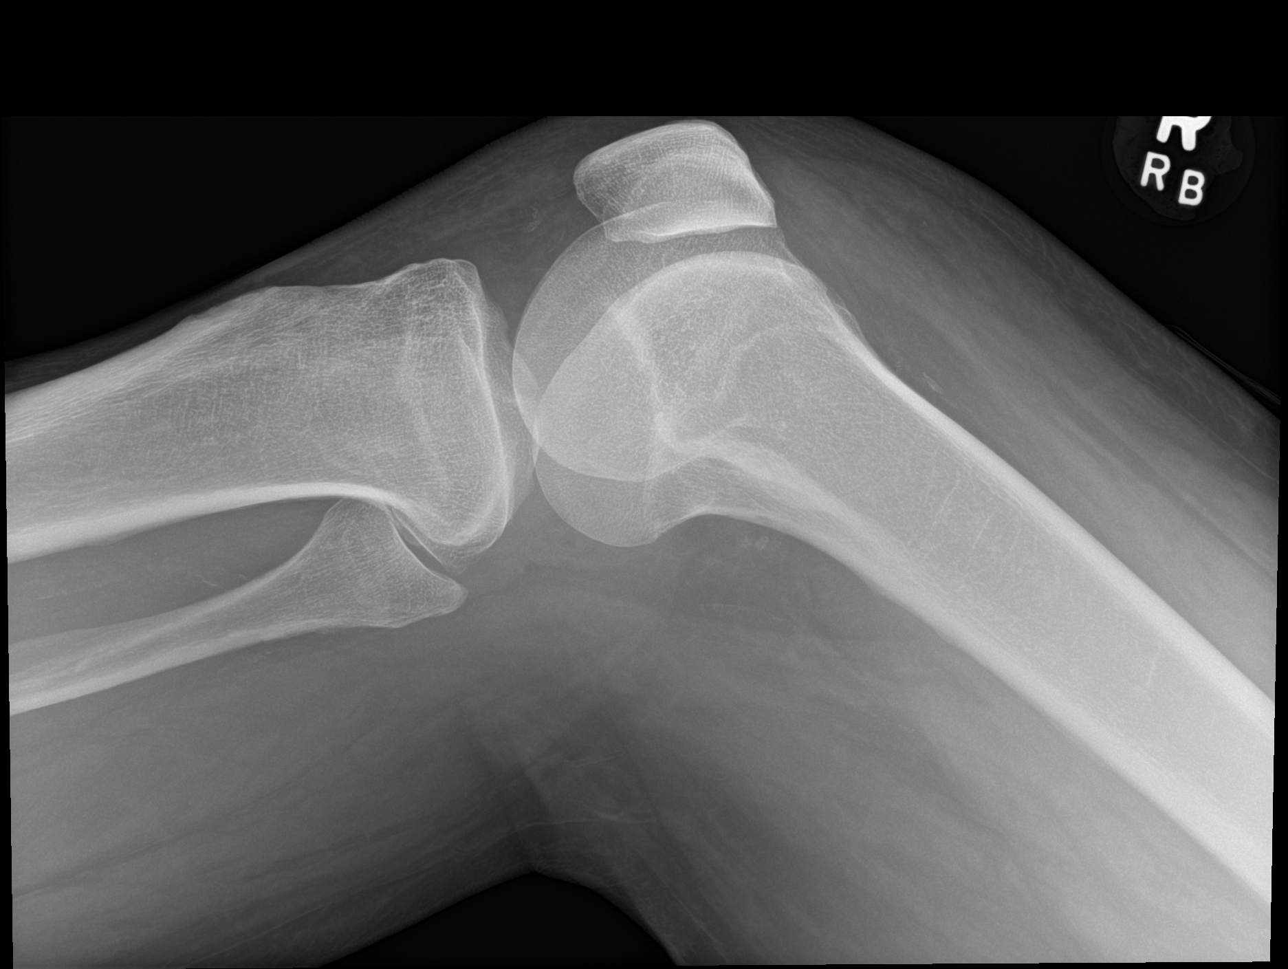

[knee obl (1 of 2)]
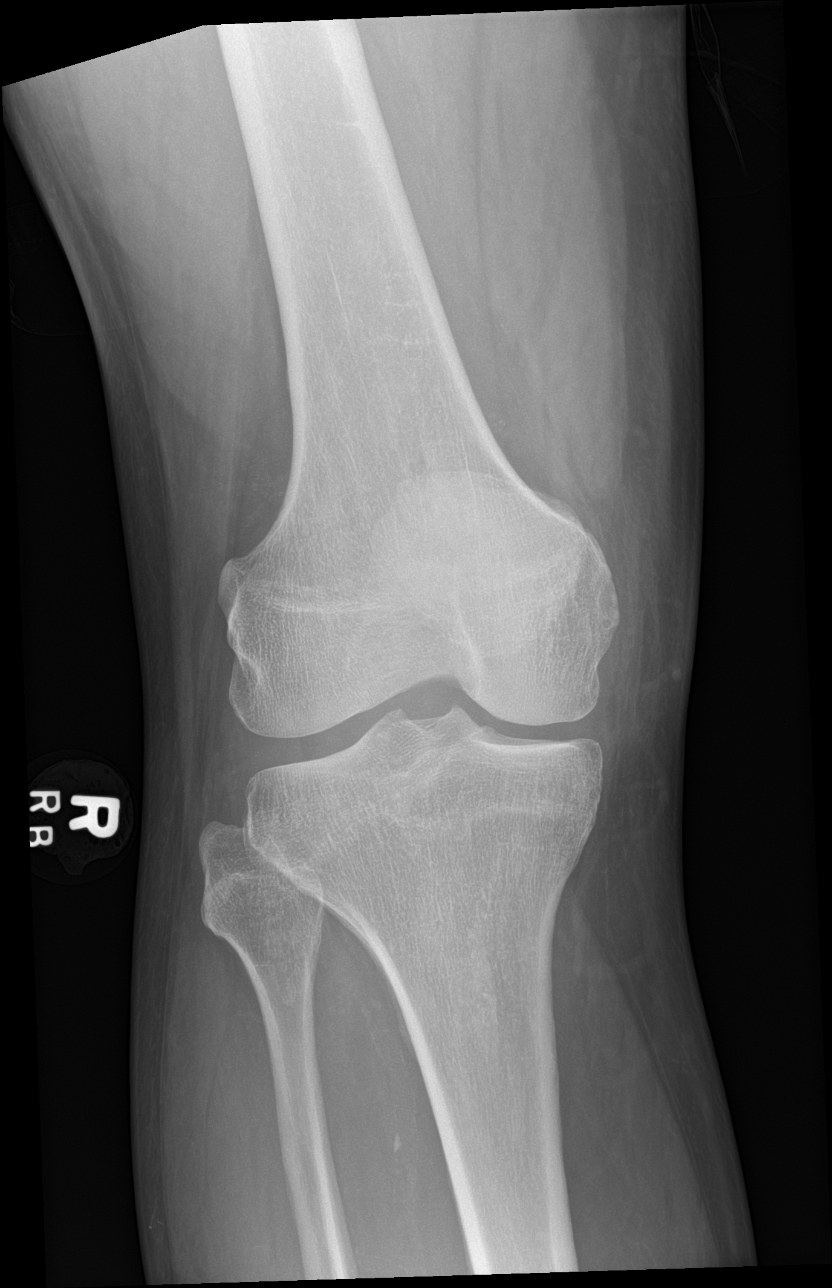

[knee obl (2 of 2)]
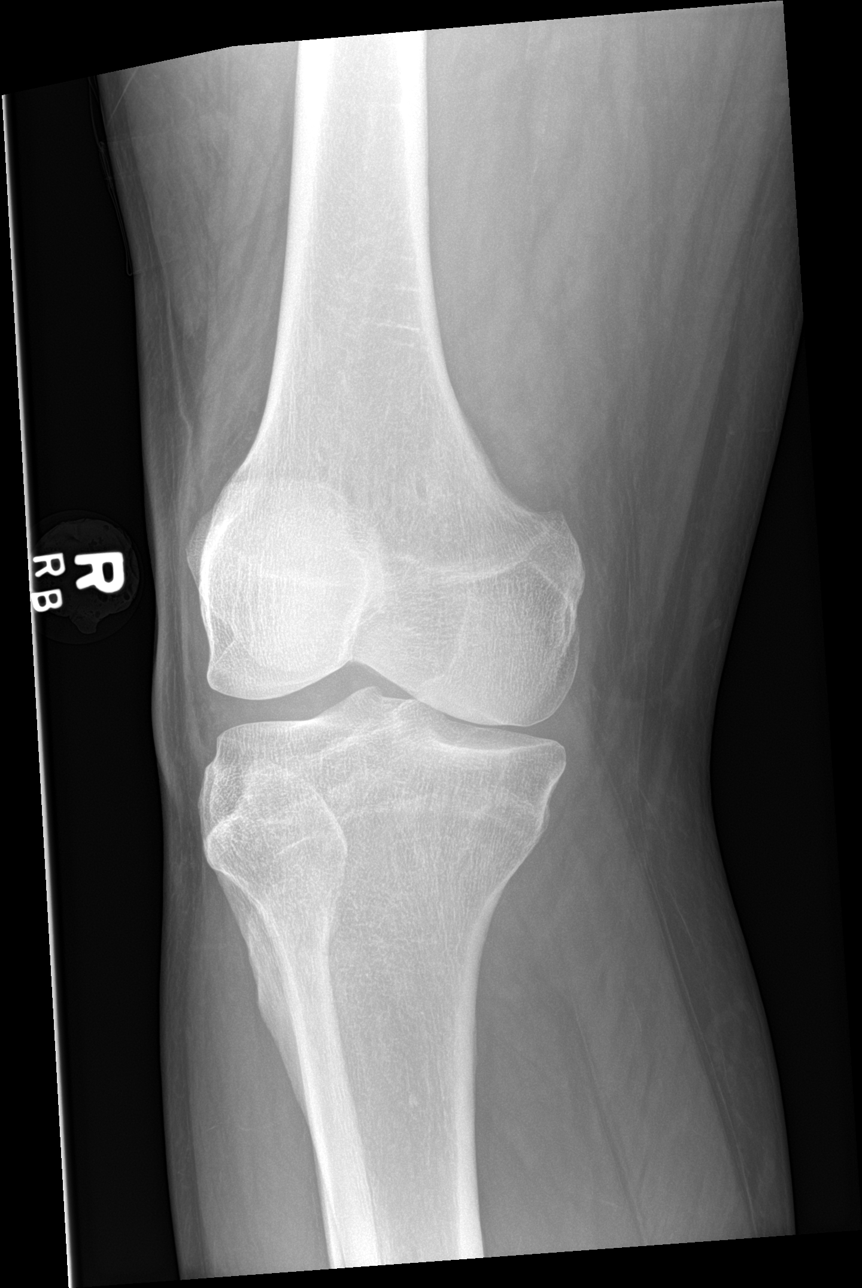

[4 of 4 positions shown; findings below may reference images not displayed]

FINDINGS: No visible joint effusion. No regional fracture. Mild medial
compartment joint space narrowing. No other focal bone finding.
IMPRESSION: Mild medial compartment joint space narrowing. No evidence of
fracture or joint effusion.

## 2022-06-21 ENCOUNTER — Telehealth (INDEPENDENT_AMBULATORY_CARE_PROVIDER_SITE_OTHER): Payer: 59 | Admitting: Family Medicine

## 2022-06-21 ENCOUNTER — Encounter: Payer: Self-pay | Admitting: Family Medicine

## 2022-06-21 VITALS — Ht 69.0 in | Wt 210.0 lb

## 2022-06-21 DIAGNOSIS — U071 COVID-19: Secondary | ICD-10-CM

## 2022-06-21 MED ORDER — NIRMATRELVIR/RITONAVIR (PAXLOVID)TABLET: 3.0000 | ORAL_TABLET | Freq: Two times a day (BID) | ORAL | 0 refills | Status: AC

## 2022-06-21 NOTE — Progress Notes (Signed)
Patient ID: Marcus Grant, male   DOB: 07-06-1956, 66 y.o.   MRN: [id number]   Virtual Visit via Video Note  I connected with on 06/21/22 at  5:15 PM EDT by a video enabled telemedicine application and verified that I am speaking with the correct person using two identifiers.  Location patient: home Location provider:work or home office Persons participating in the virtual visit: patient, provider  I discussed the limitations of evaluation and management by telemedicine and the availability of in person appointments. The patient expressed understanding and agreed to proceed.   HPI:  Marcus Grant is seen with COVID diagnosis.  He did test last night which was positive.  He and his wife just got back from a cruise in 12-day trip in [place].  They started feeling poorly around Sunday.  He had nasal congestion, cough, body aches, malaise.  Low-grade fever.  No dyspnea.  No nausea, vomiting, or diarrhea.  Both he and his wife tested positive.  His chronic problems include hypertension and dyslipidemia.  Past Medical History:  Diagnosis Date   Asthma    Colon polyps    Diverticulosis    GERD (gastroesophageal reflux disease)    Hyperlipidemia    Hypertension    Past Surgical History:  Procedure Laterality Date   COLONOSCOPY  05/04/2017   per Dr.  Fuller Grant, adenomatous polyps, repeat in 5 yrs    RETINAL DETACHMENT REPAIR W/ SCLERAL BUCKLE LE  2012   per Dr. Starling Grant in [place]     reports that he has never smoked. He has never used smokeless tobacco. He reports current alcohol use of about 2.0 standard drinks of alcohol per week. He reports that he does not use drugs. family history includes Colon polyps in an other family member; Coronary artery disease in an other family member; Dementia in his mother. No Known Allergies   ROS: See pertinent positives and negatives per HPI.  Past Medical History:  Diagnosis Date   Asthma    Colon polyps    Diverticulosis    GERD  (gastroesophageal reflux disease)    Hyperlipidemia    Hypertension     Past Surgical History:  Procedure Laterality Date   COLONOSCOPY  05/04/2017   per Dr.  Fuller Grant, adenomatous polyps, repeat in 5 yrs    RETINAL DETACHMENT REPAIR W/ SCLERAL BUCKLE LE  2012   per Dr. Starling Grant in [place]     Family History  Problem Relation Age of Onset   Dementia Mother    Coronary artery disease Other    Colon polyps Other    Colon cancer Neg Hx    Esophageal cancer Neg Hx    Stomach cancer Neg Hx    Rectal cancer Neg Hx     SOCIAL HX: Non-smoker   Current Outpatient Medications:    lisinopril-hydrochlorothiazide (ZESTORETIC) 10-12.5 MG tablet, TAKE 1 TABLET BY MOUTH DAILY (MAX ON INS), Disp: 90 tablet, Rfl: 3   montelukast (SINGULAIR) 10 MG tablet, TAKE 1 TABLET AT BEDTIME BY MOUTH, Disp: 90 tablet, Rfl: 3   nirmatrelvir/ritonavir EUA (PAXLOVID) 20 x 150 MG & 10 x '100MG'$  TABS, Take 3 tablets by mouth 2 (two) times daily for 5 days. (Take nirmatrelvir 150 mg two tablets twice daily for 5 days and ritonavir 100 mg one tablet twice daily for 5 days) Patient GFR is >60, Disp: 30 tablet, Rfl: 0   pantoprazole (PROTONIX) 40 MG tablet, Take 1 tablet (40 mg total) by mouth daily., Disp: 90 tablet, Rfl: 3  simvastatin (ZOCOR) 20 MG tablet, Take 1 tablet (20 mg total) by mouth daily., Disp: 90 tablet, Rfl: 3  EXAM:  VITALS per patient if applicable:  GENERAL: alert, oriented, appears well and in no acute distress  HEENT: atraumatic, conjunttiva clear, no obvious abnormalities on inspection of external nose and ears  NECK: normal movements of the head and neck  LUNGS: on inspection no signs of respiratory distress, breathing rate appears normal, no obvious gross SOB, gasping or wheezing  CV: no obvious cyanosis  MS: moves all visible extremities without noticeable abnormality  PSYCH/NEURO: pleasant and cooperative, no obvious depression or anxiety, speech and thought processing  grossly intact  ASSESSMENT AND Grant:  Discussed the following assessment and Grant:  COVID-day 3 of symptoms.  We discussed antiviral therapy.  Patient would like to start and we sent in Paxlovid 3 capsules by mouth twice daily for 5 days.  Hold simvastatin while taking the Paxlovid.  Plenty fluids and rest.  Patient aware of isolation precautions.  Follow-up for any persistent or worsening symptoms     I discussed the assessment and treatment Grant with the patient. The patient was provided an opportunity to ask questions and all were answered. The patient agreed with the Grant and demonstrated an understanding of the instructions.   The patient was advised to call back or seek an in-person evaluation if the symptoms worsen or if the condition fails to improve as anticipated.     Carolann Littler, MD

## 2022-06-28 ENCOUNTER — Telehealth: Payer: Self-pay | Admitting: Family Medicine

## 2022-06-28 MED ORDER — AZITHROMYCIN 250 MG PO TABS: ORAL_TABLET | ORAL | 0 refills | Status: DC

## 2022-06-28 MED ORDER — HYDROCODONE BIT-HOMATROP MBR 5-1.5 MG/5ML PO SOLN: 5.0000 mL | ORAL | 0 refills | Status: DC | PRN

## 2022-06-28 NOTE — Telephone Encounter (Signed)
Lvm for patient prescriptions has been sent.

## 2022-06-28 NOTE — Telephone Encounter (Signed)
I sent in a Zpack and some cough syrup

## 2022-06-28 NOTE — Telephone Encounter (Signed)
Pt called to inform MD that the Paxlovid prescribed is not really helping. Pt is experiencing a lot of coughing and tickling in the throat and would like MD to prescribe something else, if possible.  LVV:  06/21/22 with Dr. Elease Hashimoto.  [id number]- [place], [place] - [address] Phone:  [phone] Fax:  [phone]

## 2022-06-30 ENCOUNTER — Telehealth: Payer: Self-pay | Admitting: Family Medicine

## 2022-06-30 NOTE — Telephone Encounter (Signed)
Saw Burchette on 06/21/22, Pt being treated for covid symptoms, requesting nebulizer hoses and solution assist with breathing and cough

## 2022-06-30 NOTE — Telephone Encounter (Signed)
Please advise 

## 2022-07-03 ENCOUNTER — Ambulatory Visit (INDEPENDENT_AMBULATORY_CARE_PROVIDER_SITE_OTHER): Payer: 59 | Admitting: Family Medicine

## 2022-07-03 VITALS — BP 110/70 | HR 87 | Wt 216.0 lb

## 2022-07-03 DIAGNOSIS — J4521 Mild intermittent asthma with (acute) exacerbation: Secondary | ICD-10-CM

## 2022-07-03 DIAGNOSIS — U071 COVID-19: Secondary | ICD-10-CM | POA: Diagnosis not present

## 2022-07-03 MED ORDER — HYDROCODONE BIT-HOMATROP MBR 5-1.5 MG/5ML PO SOLN: 5.0000 mL | ORAL | 0 refills | Status: DC | PRN

## 2022-07-03 MED ORDER — METHYLPREDNISOLONE ACETATE 80 MG/ML IJ SUSP
80.0000 mg | Freq: Once | INTRAMUSCULAR | Status: AC
  Administered 2022-07-03: 80 mg via INTRAMUSCULAR

## 2022-07-03 MED ORDER — ALBUTEROL SULFATE (2.5 MG/3ML) 0.083% IN NEBU: 2.5000 mg | INHALATION_SOLUTION | RESPIRATORY_TRACT | 5 refills | Status: DC | PRN

## 2022-07-03 MED ORDER — ALBUTEROL SULFATE HFA 108 (90 BASE) MCG/ACT IN AERS: 2.0000 | INHALATION_SPRAY | RESPIRATORY_TRACT | 2 refills | Status: DC | PRN

## 2022-07-03 MED ORDER — METHYLPREDNISOLONE ACETATE 40 MG/ML IJ SUSP
40.0000 mg | Freq: Once | INTRAMUSCULAR | Status: AC
  Administered 2022-07-03: 40 mg via INTRAMUSCULAR

## 2022-07-03 NOTE — Addendum Note (Signed)
Addended by: Wyvonne Lenz on: 07/03/2022 05:28 PM   Modules accepted: Orders

## 2022-07-03 NOTE — Telephone Encounter (Signed)
scheduled

## 2022-07-03 NOTE — Progress Notes (Signed)
   Subjective:    Patient ID: Marcus Grant, male    DOB: 1956/08/23, 66 y.o.   MRN: [id number]  HPI Here for a lingering dry cough with wheezing and SOB after a recent Covid-19 infection. After a cruise to [place] he and his wife began to have fevers, headache, body aches and a dry cough. They both tested positive for the Covid virus. He had a virtual visit with Dr. Elease Hashimoto on 06-21-22, and he was treated with Paxlovid. Most of his symptoms have resolved, but he still has a cough with wheezing and mild SOB. No fevers. He is using Hycodan syrup as needed. He has asthma, but he has not had a flare of this for a few years.    Review of Systems  Constitutional: Negative.   HENT: Negative.    Eyes: Negative.   Respiratory:  Positive for cough, chest tightness, shortness of breath and wheezing.   Cardiovascular: Negative.   Gastrointestinal: Negative.        Objective:   Physical Exam Constitutional:      Appearance: Normal appearance.  HENT:     Right Ear: Tympanic membrane, ear canal and external ear normal.     Left Ear: Tympanic membrane, ear canal and external ear normal.     Nose: Nose normal.     Mouth/Throat:     Pharynx: Oropharynx is clear.  Eyes:     Conjunctiva/sclera: Conjunctivae normal.  Pulmonary:     Effort: Pulmonary effort is normal. No respiratory distress.     Breath sounds: Wheezing present. No rhonchi or rales.  Lymphadenopathy:     Cervical: No cervical adenopathy.  Neurological:     Mental Status: He is alert.           Assessment & Plan:  He getting over a Covid infection, but this has triggered an exacerbation of asthma. He is given a shot of DepoMedrol today. We refilled his albuterol inhaler. We also wrote for him to have a nebulizer at home to use as needed. Recheck prn.  Alysia Penna, MD

## 2022-07-03 NOTE — Telephone Encounter (Signed)
I will need to see him before we can order this. Please call him today and ask how he is doing

## 2022-07-06 ENCOUNTER — Other Ambulatory Visit: Payer: Self-pay | Admitting: Family Medicine

## 2022-07-06 ENCOUNTER — Telehealth: Payer: Self-pay

## 2022-07-06 MED ORDER — FLUTICASONE-SALMETEROL 115-21 MCG/ACT IN AERO: 2.0000 | INHALATION_SPRAY | Freq: Two times a day (BID) | RESPIRATORY_TRACT | 5 refills | Status: DC

## 2022-07-06 NOTE — Telephone Encounter (Signed)
Message sent to pt PCP for advise

## 2022-07-06 NOTE — Telephone Encounter (Signed)
Spoke with pt advised of Dr Fry recommendation, verbalized understanding 

## 2022-07-06 NOTE — Telephone Encounter (Signed)
I sent in a RX for a different inhaler he can try

## 2022-07-06 NOTE — Addendum Note (Signed)
Addended by: Alysia Penna A on: 07/06/2022 11:49 AM   Modules accepted: Orders

## 2022-07-07 NOTE — Telephone Encounter (Signed)
Patient called back with additional questions

## 2022-07-08 ENCOUNTER — Emergency Department (HOSPITAL_COMMUNITY): Payer: 59

## 2022-07-08 ENCOUNTER — Encounter (HOSPITAL_COMMUNITY): Payer: Self-pay

## 2022-07-08 ENCOUNTER — Other Ambulatory Visit: Payer: Self-pay

## 2022-07-08 ENCOUNTER — Inpatient Hospital Stay (HOSPITAL_COMMUNITY)
Admission: EM | Admit: 2022-07-08 | Discharge: 2022-07-13 | DRG: 871 | Disposition: A | Discharge status: Home / Self Care | Payer: 59 | Attending: Internal Medicine | Admitting: Internal Medicine

## 2022-07-08 DIAGNOSIS — B348 Other viral infections of unspecified site: Secondary | ICD-10-CM

## 2022-07-08 DIAGNOSIS — J9601 Acute respiratory failure with hypoxia: Secondary | ICD-10-CM | POA: Diagnosis present

## 2022-07-08 DIAGNOSIS — N400 Enlarged prostate without lower urinary tract symptoms: Secondary | ICD-10-CM | POA: Diagnosis present

## 2022-07-08 DIAGNOSIS — Z79899 Other long term (current) drug therapy: Secondary | ICD-10-CM

## 2022-07-08 DIAGNOSIS — M109 Gout, unspecified: Secondary | ICD-10-CM | POA: Diagnosis present

## 2022-07-08 DIAGNOSIS — K219 Gastro-esophageal reflux disease without esophagitis: Secondary | ICD-10-CM | POA: Diagnosis present

## 2022-07-08 DIAGNOSIS — I10 Essential (primary) hypertension: Secondary | ICD-10-CM | POA: Diagnosis present

## 2022-07-08 DIAGNOSIS — E669 Obesity, unspecified: Secondary | ICD-10-CM | POA: Diagnosis present

## 2022-07-08 DIAGNOSIS — J40 Bronchitis, not specified as acute or chronic: Principal | ICD-10-CM

## 2022-07-08 DIAGNOSIS — Z8616 Personal history of COVID-19: Secondary | ICD-10-CM

## 2022-07-08 DIAGNOSIS — E872 Acidosis, unspecified: Secondary | ICD-10-CM | POA: Diagnosis present

## 2022-07-08 DIAGNOSIS — B9789 Other viral agents as the cause of diseases classified elsewhere: Secondary | ICD-10-CM | POA: Diagnosis present

## 2022-07-08 DIAGNOSIS — R6520 Severe sepsis without septic shock: Secondary | ICD-10-CM | POA: Diagnosis present

## 2022-07-08 DIAGNOSIS — J189 Pneumonia, unspecified organism: Secondary | ICD-10-CM | POA: Diagnosis present

## 2022-07-08 DIAGNOSIS — A4189 Other specified sepsis: Principal | ICD-10-CM | POA: Diagnosis present

## 2022-07-08 DIAGNOSIS — R911 Solitary pulmonary nodule: Secondary | ICD-10-CM | POA: Diagnosis present

## 2022-07-08 DIAGNOSIS — Z6831 Body mass index (BMI) 31.0-31.9, adult: Secondary | ICD-10-CM | POA: Diagnosis not present

## 2022-07-08 DIAGNOSIS — J218 Acute bronchiolitis due to other specified organisms: Secondary | ICD-10-CM | POA: Diagnosis present

## 2022-07-08 DIAGNOSIS — Z7951 Long term (current) use of inhaled steroids: Secondary | ICD-10-CM | POA: Diagnosis not present

## 2022-07-08 DIAGNOSIS — E785 Hyperlipidemia, unspecified: Secondary | ICD-10-CM | POA: Diagnosis present

## 2022-07-08 DIAGNOSIS — T17990A Other foreign object in respiratory tract, part unspecified in causing asphyxiation, initial encounter: Secondary | ICD-10-CM | POA: Diagnosis present

## 2022-07-08 DIAGNOSIS — E119 Type 2 diabetes mellitus without complications: Secondary | ICD-10-CM | POA: Diagnosis present

## 2022-07-08 DIAGNOSIS — A419 Sepsis, unspecified organism: Secondary | ICD-10-CM | POA: Diagnosis present

## 2022-07-08 LAB — CBC WITH DIFFERENTIAL/PLATELET
Abs Immature Granulocytes: 0.04 10*3/uL (ref 0.00–0.07)
Basophils Absolute: 0 10*3/uL (ref 0.0–0.1)
Basophils Relative: 0 %
Eosinophils Absolute: 0 10*3/uL (ref 0.0–0.5)
Eosinophils Relative: 0 %
HCT: 48.4 % (ref 39.0–52.0)
Hemoglobin: 16.4 g/dL (ref 13.0–17.0)
Immature Granulocytes: 0 %
Lymphocytes Relative: 15 %
Lymphs Abs: 1.8 10*3/uL (ref 0.7–4.0)
MCH: 30.2 pg (ref 26.0–34.0)
MCHC: 33.9 g/dL (ref 30.0–36.0)
MCV: 89.1 fL (ref 80.0–100.0)
Monocytes Absolute: 0.9 10*3/uL (ref 0.1–1.0)
Monocytes Relative: 8 %
Neutro Abs: 9 10*3/uL — ABNORMAL HIGH (ref 1.7–7.7)
Neutrophils Relative %: 77 %
Platelets: 353 10*3/uL (ref 150–400)
RBC: 5.43 MIL/uL (ref 4.22–5.81)
RDW: 13 % (ref 11.5–15.5)
WBC: 11.8 10*3/uL — ABNORMAL HIGH (ref 4.0–10.5)
nRBC: 0 % (ref 0.0–0.2)

## 2022-07-08 LAB — BLOOD GAS, ARTERIAL
Acid-base deficit: 1 mmol/L (ref 0.0–2.0)
Bicarbonate: 22.2 mmol/L (ref 20.0–28.0)
Drawn by: 11249
O2 Content: 5 L/min
O2 Saturation: 95.4 %
Patient temperature: 37
pCO2 arterial: 32 mmHg (ref 32–48)
pH, Arterial: 7.45 (ref 7.35–7.45)
pO2, Arterial: 69 mmHg — ABNORMAL LOW (ref 83–108)

## 2022-07-08 LAB — BASIC METABOLIC PANEL
Anion gap: 12 (ref 5–15)
BUN: 12 mg/dL (ref 8–23)
CO2: 25 mmol/L (ref 22–32)
Calcium: 9.7 mg/dL (ref 8.9–10.3)
Chloride: 99 mmol/L (ref 98–111)
Creatinine, Ser: 0.83 mg/dL (ref 0.61–1.24)
GFR, Estimated: >60 mL/min (ref >60)
Glucose, Bld: 104 mg/dL — ABNORMAL HIGH (ref 70–99)
Potassium: 3.8 mmol/L (ref 3.5–5.1)
Sodium: 136 mmol/L (ref 135–145)

## 2022-07-08 LAB — BRAIN NATRIURETIC PEPTIDE: B Natriuretic Peptide: 17.3 pg/mL (ref 0.0–100.0)

## 2022-07-08 LAB — TROPONIN I (HIGH SENSITIVITY): Troponin I (High Sensitivity): 5 ng/L (ref <18)

## 2022-07-08 LAB — D-DIMER, QUANTITATIVE: D-Dimer, Quant: 0.46 ug/mL-FEU (ref 0.00–0.50)

## 2022-07-08 MED ORDER — LEVETIRACETAM 500 MG PO TABS
500.0000 mg | ORAL_TABLET | Freq: Two times a day (BID) | ORAL | Status: DC
  Filled 2022-07-08: qty 1

## 2022-07-08 MED ORDER — IPRATROPIUM-ALBUTEROL 0.5-2.5 (3) MG/3ML IN SOLN
3.0000 mL | Freq: Once | RESPIRATORY_TRACT | Status: AC
  Administered 2022-07-08: 3 mL via RESPIRATORY_TRACT
  Filled 2022-07-08: qty 3

## 2022-07-08 MED ORDER — ACETAMINOPHEN 325 MG PO TABS
650.0000 mg | ORAL_TABLET | Freq: Four times a day (QID) | ORAL | Status: DC | PRN
  Administered 2022-07-11 (×2): 650 mg via ORAL
  Filled 2022-07-08 (×2): qty 2

## 2022-07-08 MED ORDER — IOHEXOL 350 MG/ML SOLN
100.0000 mL | Freq: Once | INTRAVENOUS | Status: AC | PRN
  Administered 2022-07-08: 100 mL via INTRAVENOUS

## 2022-07-08 MED ORDER — IOHEXOL 300 MG/ML  SOLN: 100.0000 mL | Freq: Once | INTRAMUSCULAR | Status: DC | PRN

## 2022-07-08 MED ORDER — HYDROCODONE-ACETAMINOPHEN 5-325 MG PO TABS: 1.0000 | ORAL_TABLET | ORAL | Status: DC | PRN

## 2022-07-08 MED ORDER — GUAIFENESIN ER 600 MG PO TB12
600.0000 mg | ORAL_TABLET | Freq: Two times a day (BID) | ORAL | Status: DC
  Administered 2022-07-09: 600 mg via ORAL
  Filled 2022-07-08: qty 1

## 2022-07-08 MED ORDER — SODIUM CHLORIDE 0.9 % IV SOLN
2.0000 g | INTRAVENOUS | Status: DC
  Administered 2022-07-09 – 2022-07-12 (×4): 2 g via INTRAVENOUS
  Filled 2022-07-08 (×5): qty 20

## 2022-07-08 MED ORDER — SODIUM CHLORIDE 0.9 % IV SOLN: 500.0000 mg | INTRAVENOUS | Status: DC

## 2022-07-08 MED ORDER — ALBUTEROL SULFATE (2.5 MG/3ML) 0.083% IN NEBU
2.5000 mg | INHALATION_SOLUTION | RESPIRATORY_TRACT | Status: DC | PRN
  Filled 2022-07-08: qty 3

## 2022-07-08 MED ORDER — IPRATROPIUM BROMIDE 0.02 % IN SOLN
0.5000 mg | Freq: Once | RESPIRATORY_TRACT | Status: AC
  Administered 2022-07-08: 0.5 mg via RESPIRATORY_TRACT
  Filled 2022-07-08: qty 2.5

## 2022-07-08 MED ORDER — SODIUM CHLORIDE 0.9 % IV SOLN
500.0000 mg | INTRAVENOUS | Status: DC
  Administered 2022-07-09 – 2022-07-10 (×3): 500 mg via INTRAVENOUS
  Filled 2022-07-08 (×3): qty 5

## 2022-07-08 MED ORDER — MONTELUKAST SODIUM 10 MG PO TABS
10.0000 mg | ORAL_TABLET | Freq: Every day | ORAL | Status: DC
  Administered 2022-07-10 – 2022-07-12 (×3): 10 mg via ORAL
  Filled 2022-07-08 (×3): qty 1

## 2022-07-08 MED ORDER — MAGNESIUM SULFATE 2 GM/50ML IV SOLN
2.0000 g | Freq: Once | INTRAVENOUS | Status: AC
  Administered 2022-07-08: 2 g via INTRAVENOUS
  Filled 2022-07-08: qty 50

## 2022-07-08 MED ORDER — PANTOPRAZOLE SODIUM 40 MG PO TBEC
40.0000 mg | DELAYED_RELEASE_TABLET | Freq: Every day | ORAL | Status: DC
  Administered 2022-07-09 – 2022-07-13 (×5): 40 mg via ORAL
  Filled 2022-07-08 (×5): qty 1

## 2022-07-08 MED ORDER — ACETAMINOPHEN 650 MG RE SUPP: 650.0000 mg | Freq: Four times a day (QID) | RECTAL | Status: DC | PRN

## 2022-07-08 MED ORDER — SODIUM CHLORIDE 0.9 % IV SOLN: INTRAVENOUS | Status: DC

## 2022-07-08 MED ORDER — SIMVASTATIN 20 MG PO TABS
20.0000 mg | ORAL_TABLET | Freq: Every day | ORAL | Status: DC
  Administered 2022-07-09 – 2022-07-13 (×5): 20 mg via ORAL
  Filled 2022-07-08 (×2): qty 2
  Filled 2022-07-08 (×3): qty 1

## 2022-07-08 MED ORDER — METHYLPREDNISOLONE SODIUM SUCC 125 MG IJ SOLR
125.0000 mg | Freq: Once | INTRAMUSCULAR | Status: AC
  Administered 2022-07-08: 125 mg via INTRAVENOUS
  Filled 2022-07-08: qty 2

## 2022-07-08 MED ORDER — SODIUM CHLORIDE 0.9 % IV SOLN
1.0000 g | Freq: Once | INTRAVENOUS | Status: AC
  Administered 2022-07-08: 1 g via INTRAVENOUS
  Filled 2022-07-08: qty 10

## 2022-07-08 MED ORDER — ALBUTEROL SULFATE (2.5 MG/3ML) 0.083% IN NEBU
10.0000 mg/h | INHALATION_SOLUTION | Freq: Once | RESPIRATORY_TRACT | Status: AC
  Administered 2022-07-08: 10 mg/h via RESPIRATORY_TRACT
  Filled 2022-07-08: qty 12

## 2022-07-08 NOTE — ED Notes (Signed)
Pt oxygen set to [facility] per MD provider.

## 2022-07-08 NOTE — ED Notes (Addendum)
Pt sats 84 on RA. Provider notified. Pt placed on 3L [place]. Pt sats 92+.

## 2022-07-08 NOTE — Assessment & Plan Note (Addendum)
- -  Patient presenting with  productive cough,   Hypoxia  and infiltrate   -Infiltrate on CXR and 2-3 characteristics (fever, leukocytosis, purulent sputum) are consistent with pneumonia. -This appears to be most likely community-acquired pneumonia.         will admit for treatment of CAP will start on appropriate antibiotic coverage. - Rocephin/azithromycin   Obtain:  sputum cultures,               recent covid infection                   blood cultures and sputum cultures ordered                   strep pneumo UA antigen,                      check for Legionella antigen.                Provide oxygen as needed.  Order flutter valve and Mucinex given mucous plugging.  May need pulmonology consult in a.m.

## 2022-07-08 NOTE — ED Triage Notes (Signed)
Patient states that he had Covid on 06/18/22 following a 10 days cruise to [place]. Patient states he has been seen and treated for a cough, SOB by  his physician. Patient states his symptoms are worse for a non productive cough and nasal congestion. Patient states he has to use his Albuterol inhaler more and has had codeine for a cough which he says is not helping. Sats in triage 93% on room air.

## 2022-07-08 NOTE — ED Notes (Signed)
Called patiaenat to triage, but is in the ED Lobby bathroom.

## 2022-07-08 NOTE — ED Provider Notes (Signed)
Physical Exam  BP 138/77   Pulse (!) 101   Temp 97.9 F (36.6 C) (Oral)   Resp 17   Ht '5\' 9"'$  (1.753 m)   Wt 97.1 kg   SpO2 90%   BMI 31.60 kg/m   Physical Exam Vitals and nursing note reviewed.  Constitutional:      Appearance: Normal appearance.     Comments: Uncomfortable, but nontoxic-appearing  Eyes:     General: No scleral icterus. Pulmonary:     Effort: Pulmonary effort is normal. No tachypnea, accessory muscle usage or respiratory distress.     Comments: No tachypnea.  He has significant inspiratory wheezing and expiratory coarse rhonchi heard throughout all lung fields.  He has low saturation on room air.  He has no cyanosis, nasal flaring, tripoding, accessory muscle use.  He is speaking in full sentences with ease. Skin:    General: Skin is dry.     Findings: No rash.  Neurological:     General: No focal deficit present.     Mental Status: He is alert. Mental status is at baseline.  Psychiatric:        Mood and Affect: Mood normal.     Procedures  Procedures  ED Course / MDM    Medical Decision Making Amount and/or Complexity of Data Reviewed Labs: ordered. Radiology: ordered.  Risk Prescription drug management. Decision regarding hospitalization.   Accepted handoff at shift change from [facility], [place]. Please see prior provider note for more detail.   Briefly: Patient is 66 y.o. M presents to the ED for evaluation of recurrent SOB since COVID earlier in the month  DDX: concern for PE vs. ACS  Plan: follow up on D-dimer and respiratory panel.   On my evaluation, patient is satting 88% on room air.  He has no increased work of breathing.  No accessory muscle use, nasal flaring, tripoding, cyanosis.  He is speaking in full sentences with ease.  He has significant inspiratory wheezing and expiratory and coarse rhonchi heard throughout.  Given that he has had worsening saturation on room air, and has failed multiple outpatient treatments, I had a  shared decision making about ordering a CT for the patient.  We discussed the risk and benefits.  He agrees that he would like to receive a CT.  Again, I think this is reasonable.  We will order CT.  I ordered the patient another DuoNeb.  He has already received albuterol, mag, and Solu-Medrol.  Patient was at 85 to 90% on 2 L.  Bumped him up to 5 L.  He is still between 88 to 90%.  Again, he is in no acute respiratory distress.  He has no nasal flaring, tripoding, accessory muscle use, cyanosis.  He is still speaking in full sentences with ease.  CT imaging of his chest shows IMPRESSION: 1. No central pulmonary embolism. The segmental arteries in the lung bases are obscured by motion. 2. Bronchitis with mucous plugging and near-complete occlusion of the left lower lobe bronchus secondary to frothy mucous. Aspiration not excluded. 3. 2.2 cm (average dimension) ground-glass opacity in the left apex may be infectious/inflammatory however malignancy is not excluded. Initial follow-up with CT at 6 months is recommended to confirm persistence. If persistent, repeat CT is recommended every 2 years until 5 years of stability has been established.  Given the patient's now worsening hypoxia, however given that he does not have any increased work of breathing, I do think since he has failed multiple outpatient  treatments as well as steroids that the patient could benefit from admission and possible bronchoscopy.  I discussed possible admission with the patient.  He is amenable.  Order him azithromycin and Rocephin to cover.  Spoke with Dr. Roel Cluck [facility] hospital[facility] who accepts admission.  I discussed this case with my attending physician who cosigned this note including patient's presenting symptoms, physical exam, and planned diagnostics and interventions. Attending physician stated agreement with plan or made changes to plan which were implemented.   Results for orders placed or performed during the hospital  encounter of 07/08/22  CBC with Differential  Result Value Ref Range   WBC 11.8 (H) 4.0 - 10.5 K/uL   RBC 5.43 4.22 - 5.81 MIL/uL   Hemoglobin 16.4 13.0 - 17.0 g/dL   HCT 48.4 39.0 - 52.0 %   MCV 89.1 80.0 - 100.0 fL   MCH 30.2 26.0 - 34.0 pg   MCHC 33.9 30.0 - 36.0 g/dL   RDW 13.0 11.5 - 15.5 %   Platelets 353 150 - 400 K/uL   nRBC 0.0 0.0 - 0.2 %   Neutrophils Relative % 77 %   Neutro Abs 9.0 (H) 1.7 - 7.7 K/uL   Lymphocytes Relative 15 %   Lymphs Abs 1.8 0.7 - 4.0 K/uL   Monocytes Relative 8 %   Monocytes Absolute 0.9 0.1 - 1.0 K/uL   Eosinophils Relative 0 %   Eosinophils Absolute 0.0 0.0 - 0.5 K/uL   Basophils Relative 0 %   Basophils Absolute 0.0 0.0 - 0.1 K/uL   Immature Granulocytes 0 %   Abs Immature Granulocytes 0.04 0.00 - 0.07 K/uL  Basic metabolic panel  Result Value Ref Range   Sodium 136 135 - 145 mmol/L   Potassium 3.8 3.5 - 5.1 mmol/L   Chloride 99 98 - 111 mmol/L   CO2 25 22 - 32 mmol/L   Glucose, Bld 104 (H) 70 - 99 mg/dL   BUN 12 8 - 23 mg/dL   Creatinine, Ser 0.83 0.61 - 1.24 mg/dL   Calcium 9.7 8.9 - 10.3 mg/dL   GFR, Estimated >60 >60 mL/min   Anion gap 12 5 - 15  Brain natriuretic peptide  Result Value Ref Range   B Natriuretic Peptide 17.3 0.0 - 100.0 pg/mL  D-dimer, quantitative  Result Value Ref Range   D-Dimer, Quant 0.46 0.00 - 0.50 ug/mL-FEU  Troponin I (High Sensitivity)  Result Value Ref Range   Troponin I (High Sensitivity) 5 <18 ng/L   CT Angio Chest PE W and/or Wo Contrast  Result Date: 07/08/2022 CLINICAL DATA:  Increasing shortness of breath; PE suspected EXAM: CT ANGIOGRAPHY CHEST WITH CONTRAST TECHNIQUE: Multidetector CT imaging of the chest was performed using the standard protocol during bolus administration of intravenous contrast. Multiplanar CT image reconstructions and MIPs were obtained to evaluate the vascular anatomy. RADIATION DOSE REDUCTION: This exam was performed according to the departmental dose-optimization  program which includes automated exposure control, adjustment of the mA and/or kV according to patient size and/or use of iterative reconstruction technique. CONTRAST:  134m OMNIPAQUE IOHEXOL 350 MG/ML SOLN COMPARISON:  Radiographs earlier today FINDINGS: Cardiovascular: Satisfactory opacification of the central pulmonary arteries. The bilateral lower lobe segmental pulmonary arteries are obscured by motion. No evidence of pulmonary embolism. Normal heart size. No pericardial effusion. Mediastinum/Nodes: No enlarged mediastinal, hilar, or axillary lymph nodes. Thyroid gland, trachea, and esophagus demonstrate no significant findings. Lungs/Pleura: Marked bronchial wall thickening and mucous plugging in the bilateral lower lobes and lingula.  Frothy mucous near completely occludes the left lower lobe bronchus. Geographic ground-glass opacity in the left apex measures 2.1 x 2.3 cm (series 10/image 16). Additional small solid pulmonary nodule in the lingula measures 3 mm (series 10/image 100). No pleural effusion or pneumothorax. Upper Abdomen: Hepatic steatosis.  No acute abnormality. Musculoskeletal: No chest wall abnormality. No acute osseous findings. Review of the MIP images confirms the above findings. IMPRESSION: 1. No central pulmonary embolism. The segmental arteries in the lung bases are obscured by motion. 2. Bronchitis with mucous plugging and near-complete occlusion of the left lower lobe bronchus secondary to frothy mucous. Aspiration not excluded. 3. 2.2 cm (average dimension) ground-glass opacity in the left apex may be infectious/inflammatory however malignancy is not excluded. Initial follow-up with CT at 6 months is recommended to confirm persistence. If persistent, repeat CT is recommended every 2 years until 5 years of stability has been established. This recommendation follows the consensus statement: Guidelines for Management of Incidental Pulmonary Nodules Detected on CT Images: From the  Fleischner Society 2017; Radiology 2017; 284:228-243. Electronically Signed   By: Placido Sou M.D.   On: 07/08/2022 22:17   DG Chest 2 View  Result Date: 07/08/2022 CLINICAL DATA:  Shortness of breath, cough EXAM: CHEST - 2 VIEW COMPARISON:  04/05/2020 FINDINGS: The heart size and mediastinal contours are within normal limits. Both lungs are clear. Disc degenerative disease of the thoracic spine. IMPRESSION: No acute abnormality of the lungs. Electronically Signed   By: Delanna Ahmadi M.D.   On: 07/08/2022 16:40       Sherrell Puller, PA-C 07/08/22 2321    Tretha Sciara, MD 07/09/22 1504

## 2022-07-08 NOTE — Assessment & Plan Note (Addendum)
Likely secondary to mucous plugging and pneumonia although inflammation/malignancy not ruled out.  Would appreciate pulmonology consult in the morning.  this patient has acute respiratory failure with Hypoxia and  as documented by the presence of following: O2 saturatio< 90% on RA   Likely due to:  Pneumonia Provide O2 therapy and titrate as needed  Continuous pulse ox Chest PT Once oxygenation is stable can try hypertonic nebs   check Pulse ox with ambulation prior to discharge   may need  TC consult for home O2 set up    flutter valve ordered

## 2022-07-08 NOTE — ED Provider Notes (Signed)
[facility] DEPT Provider Note   CSN: [id number] Arrival date & time: 07/08/22  1453     History  Chief Complaint  Patient presents with   Cough   Shortness of Breath   Chest Pain    Marcus Grant is a 66 y.o. male with a past medical history of asthma presenting today with shortness of breath, and chest tightness.  Reports that this has been going on since early September when he was diagnosed with COVID.  He was originally prescribed Paxlovid but this did not improve his symptoms.  He subsequently returned to his PCP who gave him a one-time dose of steroids.  This did not improve his symptoms so then he was given a Z-Pak which did not help him either.  His wife has an albuterol nebulizer solution at home and she has been giving him treatments with may be mildly help but he continues to feel short of breath and wheezing.  Says is worse when he lays down at night.  No history of heart failure.   Of note, patient went to urgent care prior to his visit today and they sent him here with a potential concern about his chest x-ray.  They did not express what they were concerned about   Cough Associated symptoms: chest pain and shortness of breath   Shortness of Breath Associated symptoms: chest pain and cough   Chest Pain Associated symptoms: cough and shortness of breath        Home Medications Prior to Admission medications   Medication Sig Start Date End Date Taking? Authorizing Provider  albuterol (PROVENTIL) (2.5 MG/3ML) 0.083% nebulizer solution Take 3 mLs (2.5 mg total) by nebulization every 4 (four) hours as needed for wheezing or shortness of breath. 07/03/22   Laurey Morale, MD  albuterol (VENTOLIN HFA) 108 (90 Base) MCG/ACT inhaler Inhale 2 puffs into the lungs every 4 (four) hours as needed for wheezing or shortness of breath. 07/03/22   Laurey Morale, MD  HYDROcodone bit-homatropine (HYCODAN) 5-1.5 MG/5ML syrup Take 5 mLs by mouth every  4 (four) hours as needed for cough. 07/03/22   Laurey Morale, MD  lisinopril-hydrochlorothiazide (ZESTORETIC) 10-12.5 MG tablet TAKE 1 TABLET BY MOUTH DAILY (MAX ON INS) 09/26/21   Laurey Morale, MD  Mometasone Furo-Formoterol Fum [facility]) 50-5 MCG/ACT AERO Take 2 Inhalations by mouth in the morning and at bedtime. 07/07/22   Laurey Morale, MD  montelukast (SINGULAIR) 10 MG tablet TAKE 1 TABLET AT BEDTIME BY MOUTH 09/26/21   Laurey Morale, MD  pantoprazole (PROTONIX) 40 MG tablet Take 1 tablet (40 mg total) by mouth daily. 09/26/21   Laurey Morale, MD  simvastatin (ZOCOR) 20 MG tablet Take 1 tablet (20 mg total) by mouth daily. 09/26/21   Laurey Morale, MD      Allergies    Patient has no known allergies.    Review of Systems   Review of Systems  Respiratory:  Positive for cough and shortness of breath.   Cardiovascular:  Positive for chest pain.    Physical Exam Updated Vital Signs BP 132/89   Pulse (!) 107   Temp 97.9 F (36.6 C) (Oral)   Resp 18   Ht '5\' 9"'$  (1.753 m)   Wt 97.1 kg   SpO2 93%   BMI 31.60 kg/m  Physical Exam Vitals and nursing note reviewed.  Constitutional:      General: He is not in acute distress.  Appearance: Normal appearance. He is not ill-appearing.  HENT:     Head: Normocephalic and atraumatic.  Eyes:     General: No scleral icterus.    Conjunctiva/sclera: Conjunctivae normal.  Cardiovascular:     Rate and Rhythm: Regular rhythm. Tachycardia present.  Pulmonary:     Effort: Pulmonary effort is normal. No tachypnea, accessory muscle usage or respiratory distress.     Breath sounds: Examination of the right-upper field reveals wheezing and rhonchi. Examination of the left-upper field reveals wheezing and rhonchi. Examination of the right-middle field reveals wheezing and rhonchi. Examination of the left-middle field reveals wheezing and rhonchi. Examination of the right-lower field reveals wheezing and rhonchi. Examination of the left-lower field  reveals wheezing and rhonchi. Wheezing and rhonchi present.  Chest:     Chest wall: No tenderness.  Musculoskeletal:     Right lower leg: No edema.     Left lower leg: No edema.  Skin:    General: Skin is warm.     Findings: No rash.  Neurological:     Mental Status: He is alert.  Psychiatric:        Mood and Affect: Mood normal.     ED Results / Procedures / Treatments   Labs (all labs ordered are listed, but only abnormal results are displayed) Labs Reviewed  CBC WITH DIFFERENTIAL/PLATELET  BASIC METABOLIC PANEL  BRAIN NATRIURETIC PEPTIDE  D-DIMER, QUANTITATIVE  TROPONIN I (HIGH SENSITIVITY)    EKG None  Radiology DG Chest 2 View  Result Date: 07/08/2022 CLINICAL DATA:  Shortness of breath, cough EXAM: CHEST - 2 VIEW COMPARISON:  04/05/2020 FINDINGS: The heart size and mediastinal contours are within normal limits. Both lungs are clear. Disc degenerative disease of the thoracic spine. IMPRESSION: No acute abnormality of the lungs. Electronically Signed   By: Delanna Ahmadi M.D.   On: 07/08/2022 16:40    Procedures Procedures   Medications Ordered in ED Medications  magnesium sulfate IVPB 2 g 50 mL (has no administration in time range)  methylPREDNISolone sodium succinate (SOLU-MEDROL) 125 mg/2 mL injection 125 mg (has no administration in time range)  albuterol (PROVENTIL) (2.5 MG/3ML) 0.083% nebulizer solution (has no administration in time range)  ipratropium (ATROVENT) nebulizer solution 0.5 mg (has no administration in time range)    ED Course/ Medical Decision Making/ A&P                           Medical Decision Making Amount and/or Complexity of Data Reviewed Labs: ordered. Radiology: ordered.  Risk Prescription drug management.   This is a 66 year old male previously diagnosed with COVID who presents to the ED for concern of breath. The emergent differential diagnosis for shortness of breath includes, but is not limited to, Pulmonary edema,  bronchoconstriction, Pneumonia, Pulmonary embolism, Pneumotherax/ Hemothorax, Dysrythmia, ACS.     This is not an exhaustive differential.    Past Medical History / Co-morbidities / Social History: Asthma   Additional history: Per external chart review patient has been battling this since September 6th with his PCP Dr. Sarajane Jews.  Was prescribed Paxlovid on September 13.  On September 20 he continued to have worsening symptoms and had a Z-Pak prescribed to him.  On September 22 he continued to have symptoms and was requesting a refill of his albuterol outpatient.  He then had an office visit on September 25 for continuing worsening symptoms where he was given one-time IM dose of methylprednisolone.  Per further external  chart review patient [facility]  He had a chest x-ray performed which showed no acute findings.   Physical Exam: Pertinent physical exam findings include Wheezing and rhonchi in all lung fields  Lab Tests: I ordered, and personally interpreted labs.  The pertinent results include: All labs pending at this time to include D-dimer and troponin   Imaging Studies: I ordered and independently visualized and interpreted patient's chest x-ray and I agree that there are no acute findings.  No pneumonia or other concerning finding   Cardiac Monitoring:  The patient was maintained on a cardiac monitor.  My attending physician Dr. Francia Greaves viewed and interpreted the cardiac monitored which showed an underlying rhythm of: Normal sinus   Medications: I ordered medication including albuterol, magnesium, Solu-Medrol. Reevaluation of the patient after these medicines showed that the patient improved.  Patient does sound slightly improved after 10 minutes of continuous nebulizer    MDM/Disposition: This is a 66 year old male presenting today with shortness of breath.  This appears to be an ongoing concern for the last couple of weeks.  Has failed Paxlovid, Z-Pak,  albuterol and methylprednisolone.  Continues to have worsening symptoms.  At this time I believe it is reasonable to search for other etiologies of his shortness of breath.  D-dimer ordered at this time as patient has a moderate risk Wells score.  At this time patient's lab work is pending.  Patient will be signed out to PA, [employer].  Please see her note for the rest of patient's work-up and ultimate disposition.  I suspect the patient will be discharged home if his PE work-up is negative and he is able to ambulate without dropping his oxygen saturations.  Likely discharge on prednisone.  If things do not improve or patient is diagnosed with a PE I believe he will need admission.    Final Clinical Impression(s) / ED Diagnoses Final diagnoses:  None       Rochell Puett A, PA-C 07/08/22 1751    Godfrey Pick, MD 07/09/22 1301

## 2022-07-08 NOTE — Assessment & Plan Note (Signed)
Continue Zocor 20 mg p.o. daily 

## 2022-07-08 NOTE — H&P (Addendum)
Marcus Grant WLN:[id number] DOB: 06/15/1956 DOA: 07/08/2022     PCP: Marcus Morale, MD   Outpatient Specialists:  NONE    Patient arrived to ER on 07/08/22 at 1453 Referred by Attending Marcus Baker, MD   Patient coming from:    home Lives  With family    Chief Complaint:   Chief Complaint  Patient presents with   Cough   Shortness of Breath   Chest Pain    HPI: Marcus Grant is a 66 y.o. male with medical history significant of HLD HTN gout BPH    Presented with   worsening shortness of breath Was diagnosed w COVID 06/18/22 Treated but not improving Went to his PCP and was started on Zpack still no improvement  Was started on steroids   Sats in 80's on 5L Has been getting duoneb treatment Reports non productive cough and nasal congestion Has been using codeine for cough with no improvement   Does not smoke or drink      Regarding pertinent Chronic problems:    Hyperlipidemia -  on statins Zocor (simvastatin)  Lipid Panel     Component Value Date/Time   CHOL 153 09/26/2021 0841   TRIG 138.0 09/26/2021 0841   HDL 50.00 09/26/2021 0841   CHOLHDL 3 09/26/2021 0841   VLDL 27.6 09/26/2021 0841   [place] 75 09/26/2021 0841   LDLDIRECT 139.6 11/05/2006 0853     HTN on lsinopril     DM 2 -  Lab Results  Component Value Date   HGBA1C 6.4 09/26/2021   on PO meds only,    obesity-   BMI Readings from Last 1 Encounters:  07/08/22 31.60 kg/m     Asthma -well  controlled  had it as a child    While in ER:   Noted to be hypoxic down to mid 69s on room air titrated up to 5 L still persistently hypoxic down to high 80s but not in distress       CXR -  NON acute   CTA chest -   no PE,  Bronchitis with mucous plugging and near-complete occlusion of the left lower lobe bronchus secondary to frothy mucous. Aspiration not excluded. 2.2 cm (average dimension) ground-glass opacity in the left apex may be infectious/inflammatory  however malignancy is not excluded.  Following Medications were ordered in ER: Medications  azithromycin (ZITHROMAX) 500 mg in sodium chloride 0.9 % 250 mL IVPB (has no administration in time range)  cefTRIAXone (ROCEPHIN) 1 g in sodium chloride 0.9 % 100 mL IVPB (has no administration in time range)  cefTRIAXone (ROCEPHIN) 2 g in sodium chloride 0.9 % 100 mL IVPB (has no administration in time range)  azithromycin (ZITHROMAX) 500 mg in sodium chloride 0.9 % 250 mL IVPB (has no administration in time range)  magnesium sulfate IVPB 2 g 50 mL (0 g Intravenous Stopped 07/08/22 1845)  methylPREDNISolone sodium succinate (SOLU-MEDROL) 125 mg/2 mL injection 125 mg (125 mg Intravenous Given 07/08/22 1742)  albuterol (PROVENTIL) (2.5 MG/3ML) 0.083% nebulizer solution (10 mg/hr Nebulization Given 07/08/22 1713)  ipratropium (ATROVENT) nebulizer solution 0.5 mg (0.5 mg Nebulization Given 07/08/22 1713)  iohexol (OMNIPAQUE) 350 MG/ML injection 100 mL (100 mLs Intravenous Contrast Given 07/08/22 2125)  ipratropium-albuterol (DUONEB) 0.5-2.5 (3) MG/3ML nebulizer solution 3 mL (3 mLs Nebulization Given 07/08/22 2214)       ED Triage Vitals  Enc Vitals Group     BP 07/08/22 1508 132/89     Pulse  Rate 07/08/22 1508 (!) 107     Resp 07/08/22 1508 18     Temp 07/08/22 1508 97.9 F (36.6 C)     Temp Source 07/08/22 1508 Oral     SpO2 07/08/22 1508 93 %     Weight 07/08/22 1508 214 lb (97.1 kg)     Height 07/08/22 1508 '5\' 9"'$  (1.753 m)     Head Circumference --      Peak Flow --      Pain Score 07/08/22 1536 0     Pain Loc --      Pain Edu? --      Excl. in [place]? --   TMAX(24)@     _________________________________________ Significant initial  Findings: Abnormal Labs Reviewed  CBC WITH DIFFERENTIAL/PLATELET - Abnormal; Notable for the following components:      Result Value   WBC 11.8 (*)    Neutro Abs 9.0 (*)    All other components within normal limits  BASIC METABOLIC PANEL - Abnormal; Notable for  the following components:   Glucose, Bld 104 (*)    All other components within normal limits     _________________________ Troponin 5  ECG: Ordered Personally reviewed and interpreted by me showing: HR : 103 Rhythm: Sinus tachycardia Probable left atrial enlargement QTC 423     The recent clinical data is shown below. Vitals:   07/08/22 2000 07/08/22 2100 07/08/22 2200 07/08/22 2230  BP: (!) 166/89 123/78 115/76 130/86  Pulse: (!) 127 (!) 124 (!) 118 (!) 114  Resp: '16 15 20 17  '$ Temp:      TempSrc:      SpO2: 90% 95% 91% (!) 88%  Weight:      Height:         WBC     Component Value Date/Time   WBC 11.8 (H) 07/08/2022 1757   LYMPHSABS 1.8 07/08/2022 1757   MONOABS 0.9 07/08/2022 1757   EOSABS 0.0 07/08/2022 1757   BASOSABS 0.0 07/08/2022 1757        Lactic Acid, Venous    Component Value Date/Time   LATICACIDVEN 5.1 (HH) 07/08/2022 2352     Procalcitonin <0.01     No results found for this or any previous visit.   _______________________________________________ Hospitalist was called for admission for  acute respiratory failure   The following Work up has been ordered so far:  Orders Placed This Encounter  Procedures   Culture, blood (routine x 2) Call MD if unable to obtain prior to antibiotics being given   Expectorated Sputum Assessment w Gram Stain, Rflx to Resp Cult   DG Chest 2 View   CT Angio Chest PE W and/or Wo Contrast   CBC with Differential   Basic metabolic panel   Brain natriuretic peptide   D-dimer, quantitative   Blood gas, arterial (at [facility])   CK   Magnesium   Phosphorus   TSH   Prealbumin   Ferritin   Lactate dehydrogenase   HIV Antibody (routine testing w rflx)   Legionella Pneumophila Serogp 1 Ur Ag   Strep pneumoniae urinary antigen   Procalcitonin - Baseline   Procalcitonin   Cardiac monitoring   Initiate Carrier Fluid Protocol   Cardiac monitoring   Cardiac Monitoring - Continuous Indefinite   Consult to  hospitalist   Consult to respiratory care treatment (RT)   Pulse oximetry, continuous   Adult wheeze protocol - initiate by RT - includes Albuterol/Atrovent Treatment   ED EKG  Insert peripheral IV   Admit to Inpatient (patient's expected length of stay will be greater than 2 midnights or inpatient only procedure)     OTHER Significant initial  Findings:  labs showing:    Recent Labs  Lab 07/08/22 1757  NA 136  K 3.8  CO2 25  GLUCOSE 104*  BUN 12  CREATININE 0.83  CALCIUM 9.7    Cr   stable,   Lab Results  Component Value Date   CREATININE 0.83 07/08/2022   CREATININE 0.88 09/26/2021   CREATININE 0.99 09/15/2020         Plt: Lab Results  Component Value Date   PLT 353 07/08/2022       COVID-19 Labs  Recent Labs    07/08/22 1757  DDIMER 0.46      ABG    Component Value Date/Time   PHART 7.45 07/08/2022 2308   PCO2ART 32 07/08/2022 2308   PO2ART 69 (L) 07/08/2022 2308   HCO3 22.2 07/08/2022 2308   ACIDBASEDEF 1.0 07/08/2022 2308   O2SAT 95.4 07/08/2022 2308         Recent Labs  Lab 07/08/22 1757  WBC 11.8*  NEUTROABS 9.0*  HGB 16.4  HCT 48.4  MCV 89.1  PLT 353    HG/HCT  stable,      Component Value Date/Time   HGB 16.4 07/08/2022 1757   HCT 48.4 07/08/2022 1757   MCV 89.1 07/08/2022 1757      Cardiac Panel (last 3 results) Recent Labs    07/08/22 2252  CKTOTAL 155    .car BNP (last 3 results) Recent Labs    07/08/22 1920  BNP 17.3      DM  labs:  HbA1C: Recent Labs    09/26/21 0841  HGBA1C 6.4       CBG (last 3)  No results for input(s): "GLUCAP" in the last 72 hours.        Cultures: No results found for: "SDES", "SPECREQUEST", "CULT", "REPTSTATUS"   Radiological Exams on Admission: CT Angio Chest PE W and/or Wo Contrast  Result Date: 07/08/2022 CLINICAL DATA:  Increasing shortness of breath; PE suspected EXAM: CT ANGIOGRAPHY CHEST WITH CONTRAST TECHNIQUE: Multidetector CT imaging of the chest was  performed using the standard protocol during bolus administration of intravenous contrast. Multiplanar CT image reconstructions and MIPs were obtained to evaluate the vascular anatomy. RADIATION DOSE REDUCTION: This exam was performed according to the departmental dose-optimization program which includes automated exposure control, adjustment of the mA and/or kV according to patient size and/or use of iterative reconstruction technique. CONTRAST:  18m OMNIPAQUE IOHEXOL 350 MG/ML SOLN COMPARISON:  Radiographs earlier today FINDINGS: Cardiovascular: Satisfactory opacification of the central pulmonary arteries. The bilateral lower lobe segmental pulmonary arteries are obscured by motion. No evidence of pulmonary embolism. Normal heart size. No pericardial effusion. Mediastinum/Nodes: No enlarged mediastinal, hilar, or axillary lymph nodes. Thyroid gland, trachea, and esophagus demonstrate no significant findings. Lungs/Pleura: Marked bronchial wall thickening and mucous plugging in the bilateral lower lobes and lingula. Frothy mucous near completely occludes the left lower lobe bronchus. Geographic ground-glass opacity in the left apex measures 2.1 x 2.3 cm (series 10/image 16). Additional small solid pulmonary nodule in the lingula measures 3 mm (series 10/image 100). No pleural effusion or pneumothorax. Upper Abdomen: Hepatic steatosis.  No acute abnormality. Musculoskeletal: No chest wall abnormality. No acute osseous findings. Review of the MIP images confirms the above findings. IMPRESSION: 1. No central pulmonary embolism. The segmental arteries in the lung  bases are obscured by motion. 2. Bronchitis with mucous plugging and near-complete occlusion of the left lower lobe bronchus secondary to frothy mucous. Aspiration not excluded. 3. 2.2 cm (average dimension) ground-glass opacity in the left apex may be infectious/inflammatory however malignancy is not excluded. Initial follow-up with CT at 6 months is  recommended to confirm persistence. If persistent, repeat CT is recommended every 2 years until 5 years of stability has been established. This recommendation follows the consensus statement: Guidelines for Management of Incidental Pulmonary Nodules Detected on CT Images: From the Fleischner Society 2017; Radiology 2017; 284:228-243. Electronically Signed   By: Placido Sou M.D.   On: 07/08/2022 22:17   DG Chest 2 View  Result Date: 07/08/2022 CLINICAL DATA:  Shortness of breath, cough EXAM: CHEST - 2 VIEW COMPARISON:  04/05/2020 FINDINGS: The heart size and mediastinal contours are within normal limits. Both lungs are clear. Disc degenerative disease of the thoracic spine. IMPRESSION: No acute abnormality of the lungs. Electronically Signed   By: Delanna Ahmadi M.D.   On: 07/08/2022 16:40   _______________________________________________________________________________________________________ Latest  Blood pressure 130/86, pulse (!) 114, temperature 98.3 F (36.8 C), temperature source Oral, resp. rate 17, height '5\' 9"'$  (1.753 m), weight 97.1 kg, SpO2 (!) 88 %.   Vitals  labs and radiology finding personally reviewed  Review of Systems:    Pertinent positives include:    fatigue,shortness of breath at rest.  dyspnea on exertion, non-productive cough,  Constitutional:  No weight loss, night sweats, Fevers, chills, weight loss  HEENT:  No headaches, Difficulty swallowing,Tooth/dental problems,Sore throat,  No sneezing, itching, ear ache, nasal congestion, post nasal drip,  Cardio-vascular:  No chest pain, Orthopnea, PND, anasarca, dizziness, palpitations.no Bilateral lower extremity swelling  GI:  No heartburn, indigestion, abdominal pain, nausea, vomiting, diarrhea, change in bowel habits, loss of appetite, melena, blood in stool, hematemesis Resp:  no  No excess mucus, no productive cough, NoNo coughing up of blood.No change in color of mucus.No wheezing. Skin:  no rash or lesions. No  jaundice GU:  no dysuria, change in color of urine, no urgency or frequency. No straining to urinate.  No flank pain.  Musculoskeletal:  No joint pain or no joint swelling. No decreased range of motion. No back pain.  Psych:  No change in mood or affect. No depression or anxiety. No memory loss.  Neuro: no localizing neurological complaints, no tingling, no weakness, no double vision, no gait abnormality, no slurred speech, no confusion  All systems reviewed and apart from [place] all are negative _______________________________________________________________________________________________ Past Medical History:   Past Medical History:  Diagnosis Date   Asthma    Colon polyps    Diverticulosis    GERD (gastroesophageal reflux disease)    Hyperlipidemia    Hypertension       Past Surgical History:  Procedure Laterality Date   COLONOSCOPY  05/04/2017   per Dr.  Fuller Plan, adenomatous polyps, repeat in 5 yrs    RETINAL DETACHMENT REPAIR W/ SCLERAL BUCKLE LE  2012   per Dr. Starling Manns in [place] History:  Ambulatory   independently       reports that he has never smoked. He has never used smokeless tobacco. He reports current alcohol use of about 2.0 standard drinks of alcohol per week. He reports that he does not use drugs.   Family History:   Family History  Problem Relation Age of Onset   Dementia Mother    Coronary artery disease  Other    Colon polyps Other    Colon cancer Neg Hx    Esophageal cancer Neg Hx    Stomach cancer Neg Hx    Rectal cancer Neg Hx    ______________________________________________________________________________________________ Allergies: No Known Allergies   Prior to Admission medications   Medication Sig Start Date End Date Taking? Authorizing Provider  albuterol (PROVENTIL) (2.5 MG/3ML) 0.083% nebulizer solution Take 3 mLs (2.5 mg total) by nebulization every 4 (four) hours as needed for wheezing or shortness of breath.  07/03/22   Marcus Morale, MD  albuterol (VENTOLIN HFA) 108 (90 Base) MCG/ACT inhaler Inhale 2 puffs into the lungs every 4 (four) hours as needed for wheezing or shortness of breath. 07/03/22   Marcus Morale, MD  HYDROcodone bit-homatropine (HYCODAN) 5-1.5 MG/5ML syrup Take 5 mLs by mouth every 4 (four) hours as needed for cough. 07/03/22   Marcus Morale, MD  lisinopril-hydrochlorothiazide (ZESTORETIC) 10-12.5 MG tablet TAKE 1 TABLET BY MOUTH DAILY (MAX ON INS) 09/26/21   Marcus Morale, MD  Mometasone Furo-Formoterol Fum [facility]) 50-5 MCG/ACT AERO Take 2 Inhalations by mouth in the morning and at bedtime. 07/07/22   Marcus Morale, MD  montelukast (SINGULAIR) 10 MG tablet TAKE 1 TABLET AT BEDTIME BY MOUTH 09/26/21   Marcus Morale, MD  pantoprazole (PROTONIX) 40 MG tablet Take 1 tablet (40 mg total) by mouth daily. 09/26/21   Marcus Morale, MD  simvastatin (ZOCOR) 20 MG tablet Take 1 tablet (20 mg total) by mouth daily. 09/26/21   Marcus Morale, MD    ___________________________________________________________________________________________________ Physical Exam:    07/08/2022   10:30 PM 07/08/2022   10:00 PM 07/08/2022    9:00 PM  Vitals with BMI  Systolic 119 147 829  Diastolic 86 76 78  Pulse 562 118 124     1. General:  in No  Acute distress    Acutely ill  -appearing 2. Psychological: Alert and   Oriented 3. Head/ENT:    Dry Mucous Membranes                          Head Non traumatic, neck supple                           Poor Dentition 4. SKIN: decreased Skin turgor,  Skin clean Dry and intact no rash 5. Heart: Regular rate and rhythm no  Murmur, no Rub or gallop 6. Lungs  no wheezes some  crackles   7. Abdomen: Soft,  non-tender, Non distended   obese  bowel sounds present 8. Lower extremities: no clubbing, cyanosis, no  edema 9. Neurologically Grossly intact, moving all 4 extremities equally   10. MSK: Normal range of motion    Chart has been  reviewed  ______________________________________________________________________________________________  Assessment/Plan  66 y.o. male with medical history significant of HLD HTN gout BPH    Admitted for acute respiratory failure   Present on Admission:  CAP (community acquired pneumonia)  Dyslipidemia  Acute respiratory failure with hypoxia ([place])  Essential hypertension  Lactic acid acidosis     CAP (community acquired pneumonia)  - -Patient presenting with  productive cough,   Hypoxia  and infiltrate   -Infiltrate on CXR and 2-3 characteristics (fever, leukocytosis, purulent sputum) are consistent with pneumonia. -This appears to be most likely community-acquired pneumonia.         will admit for treatment of CAP  will start on appropriate antibiotic coverage. - Rocephin/azithromycin   Obtain:  sputum cultures,               recent covid infection                   blood cultures and sputum cultures ordered                   strep pneumo UA antigen,                      check for Legionella antigen.                Provide oxygen as needed.  Order flutter valve and Mucinex given mucous plugging.  May need pulmonology consult in a.m.    Dyslipidemia Continue Zocor 20 mg p.o. daily  Acute respiratory failure with hypoxia (HCC) Likely secondary to mucous plugging and pneumonia although inflammation/malignancy not ruled out.  Would appreciate pulmonology consult in the morning.  this patient has acute respiratory failure with Hypoxia and  as documented by the presence of following: O2 saturatio< 90% on RA   Likely due to:  Pneumonia Provide O2 therapy and titrate as needed  Continuous pulse ox Chest PT Once oxygenation is stable can try hypertonic nebs   check Pulse ox with ambulation prior to discharge   may need  TC consult for home O2 set up    flutter valve ordered   Essential hypertension Allow permissive HTN   Lactic acid acidosis In the setting of hypoxia  and increased work of breathing Continue to follow, PCCM Consult in AM or sooner if decompensates    Other plan as per orders.  DVT prophylaxis:  SCD        Code Status:    Code Status: Not on file FULL CODE  as per patient   I had personally discussed CODE STATUS with patient and family     Family Communication:   Family   at  Bedside  plan of care was discussed   with   Son, Daughter, Wife,    Disposition Plan:         To home once workup is complete and patient is stable   Following barriers for discharge:                                                        Afebrile, white count improving able to transition to PO antibiotics                            Will likely need home health, home O2, set up                           Will need consultants to evaluate patient prior to discharge                       Would benefit from PT/OT eval prior to DC  Ordered                                      Consults called: PCCM  is aware, call back at night if decompensates  Admission status:  ED Disposition     ED Disposition  [place]: [facility] [[id number]]  Level of Care: Stepdown [14]  Admit to SDU based on following criteria: Respiratory Distress:  Frequent assessment and/or intervention to maintain adequate ventilation/respiration, pulmonary toilet, and respiratory treatment.  May admit patient to [facility] or [facility] if equivalent level of care is available:: No  Covid Evaluation: Recent COVID positive no isolation required infection day 21-90  Diagnosis: CAP (community acquired pneumonia) [[id number]]  Admitting Physician: Marcus Grant [id number]]  Attending Physician: Marcus Grant [[id number]]  Certification:: I certify there are rare and unusual circumstances requiring inpatient admission  Estimated Length of Stay: 2            inpatient     I Expect 2 midnight stay secondary to severity of patient's  current illness need for inpatient interventions justified by the following:  hemodynamic instability despite optimal treatment ( hypoxia, )  Severe lab/radiological/exam abnormalities including:    Acute respiratory fialure and extensive comorbidities including:  DM2     That are currently affecting medical management.   I expect  patient to be hospitalized for 2 midnights requiring inpatient medical care.  Patient is at high risk for adverse outcome (such as loss of life or disability) if not treated.  Indication for inpatient stay as follows:    Hemodynamic instability despite maximal medical therapy,    New or worsening hypoxia   Need for IV antibiotics, high flow [place]   Level of care stepdown  Patient is critically ill due to  hemodynamic instability  severe hypoxia and lactic acidosis They are at high risk for life/limb threatening clinical deterioration requiring frequent reassessment and modifications of care.  Services provided include examination of the patient, review of relevant ancillary tests, prescription of lifesaving therapies, review of medications and prophylactic therapy.  Total critical care time excluding separately billable procedures: 60  Minutes.      No results found for: "SARSCOV2NAA"   Precautions: admitted as recent covid  Chyna Kneece 07/09/2022, 2:21 AM    [facility]     after 2 AM please page floor coverage PA If 7AM-7PM, please contact the day team taking care of the patient using [email or web address]   Patient was evaluated in the context of the global COVID-19 pandemic, which necessitated consideration that the patient might be at risk for infection with the SARS-CoV-2 virus that causes COVID-19. Institutional protocols and algorithms that pertain to the evaluation of patients at risk for COVID-19 are in a state of rapid change based on information released by regulatory bodies including the CDC and federal and state organizations.  These policies and algorithms were followed during the patient's care.

## 2022-07-08 NOTE — ED Notes (Signed)
Pt evaluated by provider. Pt is on oxygen and speaking in full sentences. Per provider, pt is in no acute distress, and on O2.

## 2022-07-08 NOTE — Subjective & Objective (Signed)
Was diagnosed w COVID 06/18/22 Treated but not improving Went to his PCP and was started on Zpack still no improvement  Was started on steroids   Sats in 80's on 5L Has been getting duoneb treatment Reports non productive cough and nasal congestion Has been using codeine for cough with no improvement

## 2022-07-09 DIAGNOSIS — R6520 Severe sepsis without septic shock: Secondary | ICD-10-CM

## 2022-07-09 DIAGNOSIS — J189 Pneumonia, unspecified organism: Secondary | ICD-10-CM | POA: Diagnosis not present

## 2022-07-09 DIAGNOSIS — J9601 Acute respiratory failure with hypoxia: Secondary | ICD-10-CM | POA: Diagnosis not present

## 2022-07-09 DIAGNOSIS — A419 Sepsis, unspecified organism: Secondary | ICD-10-CM | POA: Diagnosis present

## 2022-07-09 LAB — TSH: TSH: 0.793 u[IU]/mL (ref 0.350–4.500)

## 2022-07-09 LAB — COMPREHENSIVE METABOLIC PANEL
ALT: 30 U/L (ref 0–44)
AST: 23 U/L (ref 15–41)
Albumin: 4 g/dL (ref 3.5–5.0)
Alkaline Phosphatase: 62 U/L (ref 38–126)
Anion gap: 11 (ref 5–15)
BUN: 14 mg/dL (ref 8–23)
CO2: 22 mmol/L (ref 22–32)
Calcium: 9.2 mg/dL (ref 8.9–10.3)
Chloride: 102 mmol/L (ref 98–111)
Creatinine, Ser: 0.71 mg/dL (ref 0.61–1.24)
GFR, Estimated: >60 mL/min (ref >60)
Glucose, Bld: 138 mg/dL — ABNORMAL HIGH (ref 70–99)
Potassium: 3.8 mmol/L (ref 3.5–5.1)
Sodium: 135 mmol/L (ref 135–145)
Total Bilirubin: 0.9 mg/dL (ref 0.3–1.2)
Total Protein: 7.6 g/dL (ref 6.5–8.1)

## 2022-07-09 LAB — CBC WITH DIFFERENTIAL/PLATELET
Abs Immature Granulocytes: 0.05 10*3/uL (ref 0.00–0.07)
Basophils Absolute: 0 10*3/uL (ref 0.0–0.1)
Basophils Relative: 0 %
Eosinophils Absolute: 0 10*3/uL (ref 0.0–0.5)
Eosinophils Relative: 0 %
HCT: 45.6 % (ref 39.0–52.0)
Hemoglobin: 15.4 g/dL (ref 13.0–17.0)
Immature Granulocytes: 1 %
Lymphocytes Relative: 9 %
Lymphs Abs: 1 10*3/uL (ref 0.7–4.0)
MCH: 30 pg (ref 26.0–34.0)
MCHC: 33.8 g/dL (ref 30.0–36.0)
MCV: 88.9 fL (ref 80.0–100.0)
Monocytes Absolute: 0.6 10*3/uL (ref 0.1–1.0)
Monocytes Relative: 5 %
Neutro Abs: 9 10*3/uL — ABNORMAL HIGH (ref 1.7–7.7)
Neutrophils Relative %: 85 %
Platelets: 368 10*3/uL (ref 150–400)
RBC: 5.13 MIL/uL (ref 4.22–5.81)
RDW: 13 % (ref 11.5–15.5)
WBC: 10.6 10*3/uL — ABNORMAL HIGH (ref 4.0–10.5)
nRBC: 0 % (ref 0.0–0.2)

## 2022-07-09 LAB — RESPIRATORY PANEL BY PCR

## 2022-07-09 LAB — PROCALCITONIN
Procalcitonin: <0.1 ng/mL
Procalcitonin: <0.1 ng/mL

## 2022-07-09 LAB — MAGNESIUM
Magnesium: 2.1 mg/dL (ref 1.7–2.4)
Magnesium: 2.1 mg/dL (ref 1.7–2.4)

## 2022-07-09 LAB — FERRITIN: Ferritin: 106 ng/mL (ref 24–336)

## 2022-07-09 LAB — TROPONIN I (HIGH SENSITIVITY): Troponin I (High Sensitivity): 3 ng/L (ref <18)

## 2022-07-09 LAB — LACTIC ACID, PLASMA
Lactic Acid, Venous: 1.6 mmol/L (ref 0.5–1.9)
Lactic Acid, Venous: 3 mmol/L (ref 0.5–1.9)
Lactic Acid, Venous: 3.3 mmol/L (ref 0.5–1.9)
Lactic Acid, Venous: 5.1 mmol/L (ref 0.5–1.9)

## 2022-07-09 LAB — LACTATE DEHYDROGENASE: LDH: 124 U/L (ref 98–192)

## 2022-07-09 LAB — C-REACTIVE PROTEIN: CRP: 2.5 mg/dL — ABNORMAL HIGH (ref <1.0)

## 2022-07-09 LAB — PHOSPHORUS
Phosphorus: 3.1 mg/dL (ref 2.5–4.6)
Phosphorus: 3.8 mg/dL (ref 2.5–4.6)

## 2022-07-09 LAB — MRSA NEXT GEN BY PCR, NASAL: MRSA by PCR Next Gen: NOT DETECTED

## 2022-07-09 LAB — CK: Total CK: 155 U/L (ref 49–397)

## 2022-07-09 LAB — PREALBUMIN: Prealbumin: 25 mg/dL (ref 18–38)

## 2022-07-09 LAB — HIV ANTIBODY (ROUTINE TESTING W REFLEX): HIV Screen 4th Generation wRfx: NONREACTIVE

## 2022-07-09 LAB — STREP PNEUMONIAE URINARY ANTIGEN: Strep Pneumo Urinary Antigen: NEGATIVE

## 2022-07-09 MED ORDER — PSEUDOEPHEDRINE HCL 60 MG PO TABS
60.0000 mg | ORAL_TABLET | Freq: Two times a day (BID) | ORAL | Status: DC
  Administered 2022-07-09 – 2022-07-13 (×8): 60 mg via ORAL
  Filled 2022-07-09 (×11): qty 1

## 2022-07-09 MED ORDER — GUAIFENESIN ER 600 MG PO TB12
1200.0000 mg | ORAL_TABLET | Freq: Two times a day (BID) | ORAL | Status: DC
  Administered 2022-07-09 – 2022-07-13 (×9): 1200 mg via ORAL
  Filled 2022-07-09 (×9): qty 2

## 2022-07-09 MED ORDER — HYDROCHLOROTHIAZIDE 12.5 MG PO TABS
12.5000 mg | ORAL_TABLET | Freq: Every day | ORAL | Status: DC
  Administered 2022-07-09 – 2022-07-13 (×5): 12.5 mg via ORAL
  Filled 2022-07-09 (×5): qty 1

## 2022-07-09 MED ORDER — METHYLPREDNISOLONE SODIUM SUCC 125 MG IJ SOLR
50.0000 mg | Freq: Two times a day (BID) | INTRAMUSCULAR | Status: DC
  Administered 2022-07-09 (×2): 50 mg via INTRAVENOUS
  Filled 2022-07-09 (×2): qty 2

## 2022-07-09 MED ORDER — TRAZODONE HCL 50 MG PO TABS
50.0000 mg | ORAL_TABLET | Freq: Every evening | ORAL | Status: DC | PRN
  Administered 2022-07-09 – 2022-07-12 (×4): 50 mg via ORAL
  Filled 2022-07-09 (×4): qty 1

## 2022-07-09 MED ORDER — CHLORHEXIDINE GLUCONATE CLOTH 2 % EX PADS
6.0000 | MEDICATED_PAD | Freq: Every day | CUTANEOUS | Status: DC
  Administered 2022-07-09 – 2022-07-11 (×3): 6 via TOPICAL

## 2022-07-09 MED ORDER — LISINOPRIL 10 MG PO TABS
10.0000 mg | ORAL_TABLET | Freq: Every day | ORAL | Status: DC
  Administered 2022-07-09 – 2022-07-13 (×5): 10 mg via ORAL
  Filled 2022-07-09 (×5): qty 1

## 2022-07-09 MED ORDER — FLUTICASONE PROPIONATE 50 MCG/ACT NA SUSP
2.0000 | Freq: Every day | NASAL | Status: DC
  Administered 2022-07-09 – 2022-07-13 (×5): 2 via NASAL
  Filled 2022-07-09: qty 16

## 2022-07-09 MED ORDER — SALINE SPRAY 0.65 % NA SOLN: 1.0000 | NASAL | Status: DC | PRN

## 2022-07-09 MED ORDER — LISINOPRIL-HYDROCHLOROTHIAZIDE 10-12.5 MG PO TABS: 1.0000 | ORAL_TABLET | Freq: Every day | ORAL | Status: DC

## 2022-07-09 NOTE — Assessment & Plan Note (Signed)
In the setting of hypoxia and increased work of breathing Continue to follow, PCCM Consult in AM or sooner if decompensates

## 2022-07-09 NOTE — Assessment & Plan Note (Addendum)
-   CTA chest shows bronchitis with mucous plugging and near complete occlusion of left lower lobe -No significant risk factors for MDRO at this time - Continue Rocephin and azithromycin - Start steroids given significant hypoxia on admission -Trend procalcitonin -Sputum culture if able; follow-up blood cultures -Check RVP -Strep pneumo urinary antigen negative, follow-up Legionella

## 2022-07-09 NOTE — Assessment & Plan Note (Signed)
-   Due to underlying pneumonia -Continue oxygen and wean as able - Continue steroids

## 2022-07-09 NOTE — Progress Notes (Signed)
Progress Note    Marcus Grant   [id number]:[id number]  DOB: 1956/03/01  DOA: 07/08/2022     1 PCP: Marcus Morale, MD  Initial CC: Cough, SOB  Hospital Course: Marcus Grant is a 66 yo male with PMH HTN, HLD, BPH, asthma, GERD who presented with worsening shortness of breath, cough, chest pain.  He was found to be hypoxic in the 80s requiring 5 L initially.  He was recently treated beginning of September for COVID with Paxlovid.  He was also treated with a Z-Pak after no major improvement. He has progressively had worsened nonproductive cough and shortness of breath.  He therefore presented to the ER for further evaluation. CXR was relatively unremarkable.  This was followed with a CTA chest which was negative for PE.  Did show bronchitis with mucous plugging and near complete occlusion of the left lower lobe.  2.2 cm groundglass opacity noted in the left apex with concern for infectious versus inflammatory vs malignant etiology.  Repeat CT was recommended in approximately 6 months. He was started on Rocephin and azithromycin and admitted for further work-up.  Interval History:  Feeling much better when seen this morning in the ER.  Still on high amount of oxygen but breathing is more comfortable.  Does complain of some worsening nasal congestion and asking for something for this.  Assessment and Plan: * CAP (community acquired pneumonia) - CTA chest shows bronchitis with mucous plugging and near complete occlusion of left lower lobe -No significant risk factors for MDRO at this time - Continue Rocephin and azithromycin - Start steroids given significant hypoxia on admission -Trend procalcitonin -Sputum culture if able; follow-up blood cultures -Check RVP -Strep pneumo urinary antigen negative, follow-up Legionella  Acute respiratory failure with hypoxia (Marcus Grant) - Due to underlying pneumonia -Continue oxygen and wean as able - Continue steroids  Severe sepsis (HCC) - Tachycardia,  tachypnea, lactic acidosis.  Presumed lung source.  Associated respiratory failure -See pneumonia treatment and work-up  Essential hypertension - Continue lisinopril-HCTZ  Dyslipidemia - Continue Zocor   Old records reviewed in assessment of this patient  Antimicrobials: Rocephin 07/08/2022 >> current Azithromycin 07/09/2022 >> current  DVT prophylaxis:  SCDs Start: 07/08/22 2350   Code Status:   Code Status: Full Code  Mobility Assessment (last 72 hours)     Mobility Assessment   No documentation.           Barriers to discharge: None Disposition Plan: Home Status is: Inpatient  Objective: Blood pressure (!) 141/83, pulse (!) 102, temperature (!) 97.4 F (36.3 C), temperature source Oral, resp. rate (!) 22, height '5\' 9"'$  (1.753 m), weight 97.1 kg, SpO2 96 %.  Examination:  Physical Exam Constitutional:      General: He is not in acute distress.    Appearance: Normal appearance.  HENT:     Head: Normocephalic and atraumatic.     Mouth/Throat:     Mouth: Mucous membranes are moist.  Eyes:     Extraocular Movements: Extraocular movements intact.  Cardiovascular:     Rate and Rhythm: Normal rate and regular rhythm.     Heart sounds: Normal heart sounds.  Pulmonary:     Effort: Pulmonary effort is normal. No respiratory distress.     Breath sounds: Rhonchi and rales present. No wheezing.  Abdominal:     General: Bowel sounds are normal. There is no distension.     Palpations: Abdomen is soft.     Tenderness: There is no abdominal tenderness.  Musculoskeletal:        General: Normal range of motion.     Cervical back: Normal range of motion and neck supple.  Skin:    General: Skin is warm and dry.  Neurological:     General: No focal deficit present.     Mental Status: He is alert.  Psychiatric:        Mood and Affect: Mood normal.        Behavior: Behavior normal.      Consultants:    Procedures:    Data Reviewed: Results for orders placed or  performed during the hospital encounter of 07/08/22 (from the past 24 hour(s))  CBC with Differential     Status: Abnormal   Collection Time: 07/08/22  5:57 PM  Result Value Ref Range   WBC 11.8 (H) 4.0 - 10.5 K/uL   RBC 5.43 4.22 - 5.81 MIL/uL   Hemoglobin 16.4 13.0 - 17.0 g/dL   HCT 48.4 39.0 - 52.0 %   MCV 89.1 80.0 - 100.0 fL   MCH 30.2 26.0 - 34.0 pg   MCHC 33.9 30.0 - 36.0 g/dL   RDW 13.0 11.5 - 15.5 %   Platelets 353 150 - 400 K/uL   nRBC 0.0 0.0 - 0.2 %   Neutrophils Relative % 77 %   Neutro Abs 9.0 (H) 1.7 - 7.7 K/uL   Lymphocytes Relative 15 %   Lymphs Abs 1.8 0.7 - 4.0 K/uL   Monocytes Relative 8 %   Monocytes Absolute 0.9 0.1 - 1.0 K/uL   Eosinophils Relative 0 %   Eosinophils Absolute 0.0 0.0 - 0.5 K/uL   Basophils Relative 0 %   Basophils Absolute 0.0 0.0 - 0.1 K/uL   Immature Granulocytes 0 %   Abs Immature Granulocytes 0.04 0.00 - 0.07 K/uL  Basic metabolic panel     Status: Abnormal   Collection Time: 07/08/22  5:57 PM  Result Value Ref Range   Sodium 136 135 - 145 mmol/L   Potassium 3.8 3.5 - 5.1 mmol/L   Chloride 99 98 - 111 mmol/L   CO2 25 22 - 32 mmol/L   Glucose, Bld 104 (H) 70 - 99 mg/dL   BUN 12 8 - 23 mg/dL   Creatinine, Ser 0.83 0.61 - 1.24 mg/dL   Calcium 9.7 8.9 - 10.3 mg/dL   GFR, Estimated >60 >60 mL/min   Anion gap 12 5 - 15  D-dimer, quantitative     Status: None   Collection Time: 07/08/22  5:57 PM  Result Value Ref Range   D-Dimer, Quant 0.46 0.00 - 0.50 ug/mL-FEU  Brain natriuretic peptide     Status: None   Collection Time: 07/08/22  7:20 PM  Result Value Ref Range   B Natriuretic Peptide 17.3 0.0 - 100.0 pg/mL  Troponin I (High Sensitivity)     Status: None   Collection Time: 07/08/22  7:20 PM  Result Value Ref Range   Troponin I (High Sensitivity) 5 <18 ng/L  Troponin I (High Sensitivity)     Status: None   Collection Time: 07/08/22 10:52 PM  Result Value Ref Range   Troponin I (High Sensitivity) 3 <18 ng/L  CK     Status:  None   Collection Time: 07/08/22 10:52 PM  Result Value Ref Range   Total CK 155 49 - 397 U/L  Magnesium     Status: None   Collection Time: 07/08/22 10:52 PM  Result Value Ref Range   Magnesium 2.1 1.7 -  2.4 mg/dL  Phosphorus     Status: None   Collection Time: 07/08/22 10:52 PM  Result Value Ref Range   Phosphorus 3.1 2.5 - 4.6 mg/dL  TSH     Status: None   Collection Time: 07/08/22 10:52 PM  Result Value Ref Range   TSH 0.793 0.350 - 4.500 uIU/mL  Ferritin     Status: None   Collection Time: 07/08/22 10:52 PM  Result Value Ref Range   Ferritin 106 24 - 336 ng/mL  Lactate dehydrogenase     Status: None   Collection Time: 07/08/22 10:52 PM  Result Value Ref Range   LDH 124 98 - 192 U/L  Procalcitonin - Baseline     Status: None   Collection Time: 07/08/22 10:52 PM  Result Value Ref Range   Procalcitonin <0.10 ng/mL  C-reactive protein     Status: Abnormal   Collection Time: 07/08/22 10:52 PM  Result Value Ref Range   CRP 2.5 (H) <1.0 mg/dL  Blood gas, arterial (at [facility])     Status: Abnormal   Collection Time: 07/08/22 11:08 PM  Result Value Ref Range   O2 Content 5.0 L/min   pH, Arterial 7.45 7.35 - 7.45   pCO2 arterial 32 32 - 48 mmHg   pO2, Arterial 69 (L) 83 - 108 mmHg   Bicarbonate 22.2 20.0 - 28.0 mmol/L   Acid-base deficit 1.0 0.0 - 2.0 mmol/L   O2 Saturation 95.4 %   Patient temperature 37.0    Collection site RIGHT RADIAL    Drawn by [id number]    Allens test (pass/fail) PASS PASS  Strep pneumoniae urinary antigen     Status: None   Collection Time: 07/08/22 11:15 PM  Result Value Ref Range   Strep Pneumo Urinary Antigen NEGATIVE NEGATIVE  Lactic acid, plasma     Status: Abnormal   Collection Time: 07/08/22 11:52 PM  Result Value Ref Range   Lactic Acid, Venous 5.1 (HH) 0.5 - 1.9 mmol/L  Lactic acid, plasma     Status: Abnormal   Collection Time: 07/09/22  4:04 AM  Result Value Ref Range   Lactic Acid, Venous 3.3 (HH) 0.5 - 1.9 mmol/L  Lactic acid,  plasma     Status: None   Collection Time: 07/09/22  7:35 AM  Result Value Ref Range   Lactic Acid, Venous 1.6 0.5 - 1.9 mmol/L  Prealbumin     Status: None   Collection Time: 07/09/22  7:36 AM  Result Value Ref Range   Prealbumin 25 18 - 38 mg/dL  Procalcitonin     Status: None   Collection Time: 07/09/22  7:36 AM  Result Value Ref Range   Procalcitonin <0.10 ng/mL  Comprehensive metabolic panel     Status: Abnormal   Collection Time: 07/09/22  7:36 AM  Result Value Ref Range   Sodium 135 135 - 145 mmol/L   Potassium 3.8 3.5 - 5.1 mmol/L   Chloride 102 98 - 111 mmol/L   CO2 22 22 - 32 mmol/L   Glucose, Bld 138 (H) 70 - 99 mg/dL   BUN 14 8 - 23 mg/dL   Creatinine, Ser 0.71 0.61 - 1.24 mg/dL   Calcium 9.2 8.9 - 10.3 mg/dL   Total Protein 7.6 6.5 - 8.1 g/dL   Albumin 4.0 3.5 - 5.0 g/dL   AST 23 15 - 41 U/L   ALT 30 0 - 44 U/L   Alkaline Phosphatase 62 38 - 126 U/L   Total  Bilirubin 0.9 0.3 - 1.2 mg/dL   GFR, Estimated >60 >60 mL/min   Anion gap 11 5 - 15  Magnesium     Status: None   Collection Time: 07/09/22  7:36 AM  Result Value Ref Range   Magnesium 2.1 1.7 - 2.4 mg/dL  Phosphorus     Status: None   Collection Time: 07/09/22  7:36 AM  Result Value Ref Range   Phosphorus 3.8 2.5 - 4.6 mg/dL  CBC with Differential/Platelet     Status: Abnormal   Collection Time: 07/09/22  7:36 AM  Result Value Ref Range   WBC 10.6 (H) 4.0 - 10.5 K/uL   RBC 5.13 4.22 - 5.81 MIL/uL   Hemoglobin 15.4 13.0 - 17.0 g/dL   HCT 45.6 39.0 - 52.0 %   MCV 88.9 80.0 - 100.0 fL   MCH 30.0 26.0 - 34.0 pg   MCHC 33.8 30.0 - 36.0 g/dL   RDW 13.0 11.5 - 15.5 %   Platelets 368 150 - 400 K/uL   nRBC 0.0 0.0 - 0.2 %   Neutrophils Relative % 85 %   Neutro Abs 9.0 (H) 1.7 - 7.7 K/uL   Lymphocytes Relative 9 %   Lymphs Abs 1.0 0.7 - 4.0 K/uL   Monocytes Relative 5 %   Monocytes Absolute 0.6 0.1 - 1.0 K/uL   Eosinophils Relative 0 %   Eosinophils Absolute 0.0 0.0 - 0.5 K/uL   Basophils Relative 0  %   Basophils Absolute 0.0 0.0 - 0.1 K/uL   Immature Granulocytes 1 %   Abs Immature Granulocytes 0.05 0.00 - 0.07 K/uL  Lactic acid, plasma     Status: Abnormal   Collection Time: 07/09/22 12:21 PM  Result Value Ref Range   Lactic Acid, Venous 3.0 (HH) 0.5 - 1.9 mmol/L    I have Reviewed nursing notes, Vitals, and Lab results since pt's last encounter. Pertinent lab results : see above I have ordered test including BMP, CBC, Mg I have reviewed the last note from staff over past 24 hours I have discussed pt's care plan and test results with nursing staff, case manager   LOS: 1 day   Dwyane Dee, MD [facility] 07/09/2022, 2:51 PM

## 2022-07-09 NOTE — Assessment & Plan Note (Signed)
Allow permissive HTN 

## 2022-07-09 NOTE — Hospital Course (Signed)
Mr. Moya is a 66 yo male with PMH HTN, HLD, BPH, asthma, GERD who presented with worsening shortness of breath, cough, chest pain.  He was found to be hypoxic in the 80s requiring 5 L initially.  He was recently treated beginning of September for COVID with Paxlovid.  He was also treated with a Z-Pak after no major improvement. He has progressively had worsened nonproductive cough and shortness of breath.  He therefore presented to the ER for further evaluation. CXR was relatively unremarkable.  This was followed with a CTA chest which was negative for PE.  Did show bronchitis with mucous plugging and near complete occlusion of the left lower lobe.  2.2 cm groundglass opacity noted in the left apex with concern for infectious versus inflammatory vs malignant etiology.  Repeat CT was recommended in approximately 6 months. He was started on Rocephin and azithromycin and admitted for further work-up.

## 2022-07-09 NOTE — Progress Notes (Signed)
RT started CPT via vest. Patient tolerated well

## 2022-07-09 NOTE — ED Notes (Signed)
Pt sats 86, pt placed on 13L high flow . MD notified.

## 2022-07-09 NOTE — Assessment & Plan Note (Signed)
Continue Zocor 

## 2022-07-09 NOTE — Assessment & Plan Note (Signed)
-   Tachycardia, tachypnea, lactic acidosis.  Presumed lung source.  Associated respiratory failure -See pneumonia treatment and work-up

## 2022-07-09 NOTE — Assessment & Plan Note (Signed)
-   Continue lisinopril HCTZ 

## 2022-07-09 NOTE — Progress Notes (Signed)
Patient asleep so RT held CPT for 0400

## 2022-07-10 ENCOUNTER — Telehealth: Payer: Self-pay | Admitting: Family Medicine

## 2022-07-10 DIAGNOSIS — B348 Other viral infections of unspecified site: Secondary | ICD-10-CM | POA: Diagnosis not present

## 2022-07-10 DIAGNOSIS — J9601 Acute respiratory failure with hypoxia: Secondary | ICD-10-CM | POA: Diagnosis not present

## 2022-07-10 DIAGNOSIS — J189 Pneumonia, unspecified organism: Secondary | ICD-10-CM | POA: Diagnosis not present

## 2022-07-10 LAB — EXPECTORATED SPUTUM ASSESSMENT W GRAM STAIN, RFLX TO RESP C

## 2022-07-10 LAB — CBC WITH DIFFERENTIAL/PLATELET
Abs Immature Granulocytes: 0.15 10*3/uL — ABNORMAL HIGH (ref 0.00–0.07)
Basophils Absolute: 0 10*3/uL (ref 0.0–0.1)
Basophils Relative: 0 %
Eosinophils Absolute: 0 10*3/uL (ref 0.0–0.5)
Eosinophils Relative: 0 %
HCT: 46.4 % (ref 39.0–52.0)
Hemoglobin: 15.1 g/dL (ref 13.0–17.0)
Immature Granulocytes: 1 %
Lymphocytes Relative: 6 %
Lymphs Abs: 1.1 10*3/uL (ref 0.7–4.0)
MCH: 30 pg (ref 26.0–34.0)
MCHC: 32.5 g/dL (ref 30.0–36.0)
MCV: 92.2 fL (ref 80.0–100.0)
Monocytes Absolute: 0.6 10*3/uL (ref 0.1–1.0)
Monocytes Relative: 3 %
Neutro Abs: 17.1 10*3/uL — ABNORMAL HIGH (ref 1.7–7.7)
Neutrophils Relative %: 90 %
Platelets: 316 10*3/uL (ref 150–400)
RBC: 5.03 MIL/uL (ref 4.22–5.81)
RDW: 13.1 % (ref 11.5–15.5)
WBC: 19 10*3/uL — ABNORMAL HIGH (ref 4.0–10.5)
nRBC: 0 % (ref 0.0–0.2)

## 2022-07-10 LAB — BASIC METABOLIC PANEL
Anion gap: 8 (ref 5–15)
BUN: 22 mg/dL (ref 8–23)
CO2: 27 mmol/L (ref 22–32)
Calcium: 8.8 mg/dL — ABNORMAL LOW (ref 8.9–10.3)
Chloride: 102 mmol/L (ref 98–111)
Creatinine, Ser: 1 mg/dL (ref 0.61–1.24)
GFR, Estimated: >60 mL/min (ref >60)
Glucose, Bld: 151 mg/dL — ABNORMAL HIGH (ref 70–99)
Potassium: 4.6 mmol/L (ref 3.5–5.1)
Sodium: 137 mmol/L (ref 135–145)

## 2022-07-10 LAB — MAGNESIUM: Magnesium: 2.2 mg/dL (ref 1.7–2.4)

## 2022-07-10 LAB — PROCALCITONIN: Procalcitonin: <0.1 ng/mL

## 2022-07-10 MED ORDER — PREDNISONE 10 MG PO TABS: 10.0000 mg | ORAL_TABLET | Freq: Every day | ORAL | Status: DC

## 2022-07-10 MED ORDER — PREDNISONE 5 MG PO TABS: 5.0000 mg | ORAL_TABLET | Freq: Every day | ORAL | Status: DC

## 2022-07-10 MED ORDER — PREDNISONE 20 MG PO TABS: 20.0000 mg | ORAL_TABLET | Freq: Every day | ORAL | Status: DC

## 2022-07-10 MED ORDER — ORAL CARE MOUTH RINSE: 15.0000 mL | OROMUCOSAL | Status: DC | PRN

## 2022-07-10 MED ORDER — SODIUM CHLORIDE 0.9 % IV SOLN: INTRAVENOUS | Status: DC | PRN

## 2022-07-10 MED ORDER — PREDNISONE 20 MG PO TABS
40.0000 mg | ORAL_TABLET | Freq: Every day | ORAL | Status: AC
  Administered 2022-07-10 – 2022-07-12 (×3): 40 mg via ORAL
  Filled 2022-07-10 (×3): qty 2

## 2022-07-10 MED ORDER — PREDNISONE 20 MG PO TABS
30.0000 mg | ORAL_TABLET | Freq: Every day | ORAL | Status: DC
  Administered 2022-07-13: 30 mg via ORAL
  Filled 2022-07-10: qty 1

## 2022-07-10 MED ORDER — IPRATROPIUM-ALBUTEROL 0.5-2.5 (3) MG/3ML IN SOLN
3.0000 mL | Freq: Four times a day (QID) | RESPIRATORY_TRACT | Status: DC
  Administered 2022-07-10 – 2022-07-12 (×10): 3 mL via RESPIRATORY_TRACT
  Filled 2022-07-10 (×10): qty 3

## 2022-07-10 MED ORDER — LACTATED RINGERS IV BOLUS
1000.0000 mL | Freq: Once | INTRAVENOUS | Status: AC
  Administered 2022-07-10: 1000 mL via INTRAVENOUS

## 2022-07-10 MED ORDER — SODIUM CHLORIDE 3 % IN NEBU
4.0000 mL | INHALATION_SOLUTION | Freq: Two times a day (BID) | RESPIRATORY_TRACT | Status: AC
  Administered 2022-07-10 – 2022-07-12 (×5): 4 mL via RESPIRATORY_TRACT
  Filled 2022-07-10 (×7): qty 4

## 2022-07-10 NOTE — Progress Notes (Signed)
Progress Note    Marcus Grant   [id number]:[id number]  DOB: 1955/10/30  DOA: 07/08/2022     2 PCP: Laurey Morale, MD  Initial CC: Cough, SOB  Hospital Course: Marcus Grant is a 66 yo male with PMH HTN, HLD, BPH, asthma, GERD who presented with worsening shortness of breath, cough, chest pain.  He was found to be hypoxic in the 80s requiring 5 L initially.  He was recently treated beginning of September for COVID with Paxlovid.  He was also treated with a Z-Pak after no major improvement. He has progressively had worsened nonproductive cough and shortness of breath.  He therefore presented to the ER for further evaluation. CXR was relatively unremarkable.  This was followed with a CTA chest which was negative for PE.  Did show bronchitis with mucous plugging and near complete occlusion of the left lower lobe.  2.2 cm groundglass opacity noted in the left apex with concern for infectious versus inflammatory vs malignant etiology.  Repeat CT was recommended in approximately 6 months. He was started on Rocephin and azithromycin and admitted for further work-up.  Interval History:  No events overnight.  Patient continuing to feel better each day.  Oxygen continues to be weaned.  He continues to cough.   Assessment and Plan: * CAP (community acquired pneumonia) - CTA chest shows bronchitis with mucous plugging and near complete occlusion of left lower lobe -No significant risk factors for MDRO at this time - Continue Rocephin (complete 5 days) and azithromycin (complete 3 days) - Start steroids given significant hypoxia on admission -Trend procalcitonin (neg x 3) -Sputum culture if able; follow-up blood cultures - RVP positive for rhinovirus -Strep pneumo urinary antigen negative, follow-up Legionella -Appreciate pulmonology evaluation.  No need for bronchoscopy given underlying mucous plugging but will need outpatient follow-up for repeat CT chest  Rhinovirus infection - Continue droplet  precautions - Continue supportive care  Acute respiratory failure with hypoxia ([place]) - Due to underlying pneumonia -Continue oxygen and wean as able - Continue steroids  Severe sepsis (HCC) - Tachycardia, tachypnea, lactic acidosis.  Presumed lung source.  Associated respiratory failure -See pneumonia treatment and work-up  Essential hypertension - Continue lisinopril-HCTZ  Dyslipidemia - Continue Zocor   Old records reviewed in assessment of this patient  Antimicrobials: Rocephin 07/08/2022 >> current Azithromycin 07/09/2022 >> current  DVT prophylaxis:  SCDs Start: 07/08/22 2350   Code Status:   Code Status: Full Code  Mobility Assessment (last 72 hours)     Mobility Assessment   No documentation.           Barriers to discharge: None Disposition Plan: Home Status is: Inpatient  Objective: Blood pressure 118/62, pulse (!) 105, temperature 98.5 F (36.9 C), temperature source Oral, resp. rate 20, height '5\' 9"'$  (1.753 m), weight 97.1 kg, SpO2 92 %.  Examination:  Physical Exam Constitutional:      General: He is not in acute distress.    Appearance: Normal appearance.  HENT:     Head: Normocephalic and atraumatic.     Mouth/Throat:     Mouth: Mucous membranes are moist.  Eyes:     Extraocular Movements: Extraocular movements intact.  Cardiovascular:     Rate and Rhythm: Normal rate and regular rhythm.     Heart sounds: Normal heart sounds.  Pulmonary:     Effort: Pulmonary effort is normal. No respiratory distress.     Breath sounds: Rhonchi and rales present. No wheezing.  Abdominal:  General: Bowel sounds are normal. There is no distension.     Palpations: Abdomen is soft.     Tenderness: There is no abdominal tenderness.  Musculoskeletal:        General: Normal range of motion.     Cervical back: Normal range of motion and neck supple.  Skin:    General: Skin is warm and dry.  Neurological:     General: No focal deficit present.      Mental Status: He is alert.  Psychiatric:        Mood and Affect: Mood normal.        Behavior: Behavior normal.      Consultants:  Pulmonology   Procedures:    Data Reviewed: Results for orders placed or performed during the hospital encounter of 07/08/22 (from the past 24 hour(s))  Procalcitonin     Status: None   Collection Time: 07/10/22  3:04 AM  Result Value Ref Range   Procalcitonin <0.10 ng/mL  Basic metabolic panel     Status: Abnormal   Collection Time: 07/10/22  3:04 AM  Result Value Ref Range   Sodium 137 135 - 145 mmol/L   Potassium 4.6 3.5 - 5.1 mmol/L   Chloride 102 98 - 111 mmol/L   CO2 27 22 - 32 mmol/L   Glucose, Bld 151 (H) 70 - 99 mg/dL   BUN 22 8 - 23 mg/dL   Creatinine, Ser 1.00 0.61 - 1.24 mg/dL   Calcium 8.8 (L) 8.9 - 10.3 mg/dL   GFR, Estimated >60 >60 mL/min   Anion gap 8 5 - 15  CBC with Differential/Platelet     Status: Abnormal   Collection Time: 07/10/22  3:04 AM  Result Value Ref Range   WBC 19.0 (H) 4.0 - 10.5 K/uL   RBC 5.03 4.22 - 5.81 MIL/uL   Hemoglobin 15.1 13.0 - 17.0 g/dL   HCT 46.4 39.0 - 52.0 %   MCV 92.2 80.0 - 100.0 fL   MCH 30.0 26.0 - 34.0 pg   MCHC 32.5 30.0 - 36.0 g/dL   RDW 13.1 11.5 - 15.5 %   Platelets 316 150 - 400 K/uL   nRBC 0.0 0.0 - 0.2 %   Neutrophils Relative % 90 %   Neutro Abs 17.1 (H) 1.7 - 7.7 K/uL   Lymphocytes Relative 6 %   Lymphs Abs 1.1 0.7 - 4.0 K/uL   Monocytes Relative 3 %   Monocytes Absolute 0.6 0.1 - 1.0 K/uL   Eosinophils Relative 0 %   Eosinophils Absolute 0.0 0.0 - 0.5 K/uL   Basophils Relative 0 %   Basophils Absolute 0.0 0.0 - 0.1 K/uL   Immature Granulocytes 1 %   Abs Immature Granulocytes 0.15 (H) 0.00 - 0.07 K/uL  Magnesium     Status: None   Collection Time: 07/10/22  3:04 AM  Result Value Ref Range   Magnesium 2.2 1.7 - 2.4 mg/dL  Expectorated Sputum Assessment w Gram Stain, Rflx to Resp Cult     Status: None   Collection Time: 07/10/22  9:49 AM   Specimen: Sputum  Result  Value Ref Range   Specimen Description SPUTUM    Special Requests SPUTUM    Sputum evaluation      Sputum specimen not acceptable for testing.  Please recollect.   CALLED TO CINDY @ 1040 07/10/22. GILBERT, L Performed at [facility], [place] [address] [place], [place] [address]    Report Status 07/10/2022 FINAL  I have Reviewed nursing notes, Vitals, and Lab results since pt's last encounter. Pertinent lab results : see above I have ordered test including BMP, CBC, Mg I have reviewed the last note from staff over past 24 hours I have discussed pt's care plan and test results with nursing staff, case manager   LOS: 2 days   Dwyane Dee, MD [facility] 07/10/2022, 3:06 PM

## 2022-07-10 NOTE — Telephone Encounter (Signed)
Left a message for pt to call the office back °

## 2022-07-10 NOTE — Telephone Encounter (Signed)
Pt is returning nancy call 

## 2022-07-10 NOTE — Assessment & Plan Note (Signed)
-   Continue droplet precautions - Continue supportive care

## 2022-07-10 NOTE — Evaluation (Addendum)
Physical Therapy Evaluation-1x Patient Details Name: Marcus Grant MRN: [id number] DOB: [birth year] Today's Date: 07/10/2022  History of Present Illness  66 yo male admitted 07/09/22 with SOB, cough, chest pain. Hypoxic in the 80's. Dx: Pna, acute resp failure. PMH includes HTN, HLD, BPH, asthma, GERD, COVID, gout.  Clinical Impression  On eval, pt was Mod Ind with mobility. O2: 91% 5L O2 at rest, 91-94% on 6L O2 with ambulation. HR 119 bpm during session. He walked ~400 feet around the ICU. Tolerated distance well. He has been mobilizing in his room unassisted. No acute PT needs. 1x eval. Will sign off.        Recommendations for follow up therapy are one component of a multi-disciplinary discharge planning process, led by the attending physician.  Recommendations may be updated based on patient status, additional functional criteria and insurance authorization.  Follow Up Recommendations No PT follow up      Assistance Recommended at Discharge None  Patient can return home with the following       Equipment Recommendations None recommended by PT  Recommendations for Other Services       Functional Status Assessment Patient has had a recent decline in their functional status and demonstrates the ability to make significant improvements in function in a reasonable and predictable amount of time.     Precautions / Restrictions Precautions Precaution Comments: monitor O2 Restrictions Weight Bearing Restrictions: No      Mobility  Bed Mobility Overal bed mobility: Modified Independent                  Transfers Overall transfer level: Modified independent                      Ambulation/Gait Ambulation/Gait assistance: Modified independent (Device/Increase time) Gait Distance (Feet): 400 Feet Assistive device: None Gait Pattern/deviations: WFL(Within Functional Limits)       General Gait Details: O2 91-94% on 6L. Pt tolerated distance  well.  Stairs            Wheelchair Mobility    Modified Rankin (Stroke Patients Only)       Balance Overall balance assessment: Modified Independent                                           Pertinent Vitals/Pain Pain Assessment Pain Assessment: No/denies pain    Home Living Family/patient expects to be discharged to:: Private residence Living Arrangements: Spouse/significant other Available Help at Discharge: Family Type of Home: House         Home Layout: One level Home Equipment: None      Prior Function Prior Level of Function : Independent/Modified Independent                     Hand Dominance        Extremity/Trunk Assessment   Upper Extremity Assessment Upper Extremity Assessment: Overall WFL for tasks assessed    Lower Extremity Assessment Lower Extremity Assessment: Overall WFL for tasks assessed    Cervical / Trunk Assessment Cervical / Trunk Assessment: Normal  Communication   Communication: No difficulties  Cognition Arousal/Alertness: Awake/alert Behavior During Therapy: WFL for tasks assessed/performed Overall Cognitive Status: Within Functional Limits for tasks assessed  General Comments      Exercises     Assessment/Plan    PT Assessment Patient does not need any further PT services  PT Problem List         PT Treatment Interventions      PT Goals (Current goals can be found in the Care Plan section)  Acute Rehab PT Goals Patient Stated Goal: to get better PT Goal Formulation: With patient Time For Goal Achievement: 07/24/22 Potential to Achieve Goals: Good    Frequency       Co-evaluation               AM-PAC PT "6 Clicks" Mobility  Outcome Measure Help needed turning from your back to your side while in a flat bed without using bedrails?: None Help needed moving from lying on your back to sitting on the side of a  flat bed without using bedrails?: None Help needed moving to and from a bed to a chair (including a wheelchair)?: None Help needed standing up from a chair using your arms (e.g., wheelchair or bedside chair)?: None Help needed to walk in hospital room?: None Help needed climbing 3-5 steps with a railing? : None 6 Click Score: 24    End of Session Equipment Utilized During Treatment: Oxygen Activity Tolerance: Patient tolerated treatment well Patient left: in bed;with call bell/phone within reach        Time: 9326-7124 PT Time Calculation (min) (ACUTE ONLY): 15 min   Charges:   PT Evaluation $PT Eval Low Complexity: [place], PT Acute Rehabilitation  Office: [phone] Pager: [phone]

## 2022-07-10 NOTE — Consult Note (Signed)
NAME:  Marcus Grant, MRN:  [id number], DOB:  09-Jan-1956, LOS: 2 ADMISSION DATE:  07/08/2022, CONSULTATION DATE:  07/10/22 REFERRING MD:  Dwyane Dee, MD CHIEF COMPLAINT:  Pneumonia   History of Present Illness:  Marcus Grant is a 66 year old male with history of GERD, hyperlipidemia, hypertension and obesity who was admitted 07/08/22 for acute hypoxemic respiratory failure. He had covid 19 06/18/22 where he was treated with paxlovid. He continue to have progressive dyspnea, cough and nasal congestion. He was noted to have SpO2 in the 80s in the ER. He was started on supplemental oxygen requiring up to 15L. CTA PE study was negative for pulmonary emboli but notable for bronchial wall thickening, mucous plugging of the bilateral lower lobes/lingula with near occlusion of the left lower lobe bronchus. There is small solid 27m nodule in lingula and groundglass opacity 2cm in LUL.   He is being treated with ceftriaxone and azithromycin. He has been unable to produce a sputum sample for culture. Respiratory viral panel is positive for rhinovirus.   O2 requirements have started to wean down to 6L.   Patient is feeling better overall today. Is having trouble coughing out mucous. Denies any fevers, chills or nausea/vomiting.   PCCM consulted for respiratory failure due to viral pneumonia and need for possible bronchoscopy.   Pertinent  Medical History   Past Medical History:  Diagnosis Date   Asthma    Colon polyps    Diverticulosis    GERD (gastroesophageal reflux disease)    Hyperlipidemia    Hypertension    Significant Hospital Events: Including procedures, antibiotic start and stop dates in addition to other pertinent events   9/30 admitted to step down unit for respiratory failure, requiring 15L 10/2 PCCM consulted  Interim History / Subjective:  As above  Objective   Blood pressure (!) 138/92, pulse (!) 118, temperature 97.6 F (36.4 C), temperature source Oral, resp. rate  17, height '5\' 9"'$  (1.753 m), weight 97.1 kg, SpO2 94 %.        Intake/Output Summary (Last 24 hours) at 07/10/2022 0759 Last data filed at 07/10/2022 0400 Gross per 24 hour  Intake 601.11 ml  Output 1150 ml  Net -548.89 ml   Filed Weights   07/08/22 1508  Weight: 97.1 kg    Examination: General: obese male, no acute distress, sitting up on bedside HENT: [place]/AT, moist mucosa Lungs: inspiratory rhonchi and chirps Cardiovascular: tachycardic, s1s2, no murmurs Abdomen: soft, non-tender, non-distended, BS+ Extremities: warm, no edema Neuro: alert, moving all extremities GU: n/a  CTA Chest 07/08/22 1. No central pulmonary embolism. The segmental arteries in the lung bases are obscured by motion. 2. Bronchitis with mucous plugging and near-complete occlusion of the left lower lobe bronchus secondary to frothy mucous. Aspiration not excluded. 3. 2.2 cm (average dimension) ground-glass opacity in the left apex may be infectious/inflammatory however malignancy is not excluded.  Resolved Hospital Problem list     Assessment & Plan:  Acute Hypoxemic Respiratory Failure Respiratory Bronchiolitis  Sepsis due to viral pneumonia Mucous Plugging Pulmonary Nodule Lactic Acidosis  Plan: - Continue to wean supplemental O2 for goal SpO2 92% or greater - Steroid taper ordered - Start hypertonic saline nebs BID and duonebs q6hrs for pulmonary hygiene - Continue flutter valve/IS - ok to complete 3 days of azithromycin '500mg'$  and 5 days of ceftriaxone - Will give liter LR bolus this morning as patient remains tachycardic - Will need follow up in pulmonary clinic for repeat CT chest  scan to follow up resolution of pneumonia and follow pulmonary nodules - No need for bronchoscopy at this time  PCCM will continue to follow  Best Practice (right click and "Reselect all SmartList Selections" daily)   Per [facility]  Labs   CBC: Recent Labs  Lab 07/08/22 1757 07/09/22 0736 07/10/22 0304  WBC  11.8* 10.6* 19.0*  NEUTROABS 9.0* 9.0* 17.1*  HGB 16.4 15.4 15.1  HCT 48.4 45.6 46.4  MCV 89.1 88.9 92.2  PLT 353 368 644    Basic Metabolic Panel: Recent Labs  Lab 07/08/22 1757 07/08/22 2252 07/09/22 0736 07/10/22 0304  NA 136  --  135 137  K 3.8  --  3.8 4.6  CL 99  --  102 102  CO2 25  --  22 27  GLUCOSE 104*  --  138* 151*  BUN 12  --  14 22  CREATININE 0.83  --  0.71 1.00  CALCIUM 9.7  --  9.2 8.8*  MG  --  2.1 2.1 2.2  PHOS  --  3.1 3.8  --    GFR: Estimated Creatinine Clearance: 85.8 mL/min (by C-G formula based on SCr of 1 mg/dL). Recent Labs  Lab 07/08/22 1757 07/08/22 2252 07/08/22 2352 07/09/22 0404 07/09/22 0735 07/09/22 0736 07/09/22 1221 07/10/22 0304  PROCALCITON  --  <0.10  --   --   --  <0.10  --  <0.10  WBC 11.8*  --   --   --   --  10.6*  --  19.0*  LATICACIDVEN  --   --  5.1* 3.3* 1.6  --  3.0*  --     Liver Function Tests: Recent Labs  Lab 07/09/22 0736  AST 23  ALT 30  ALKPHOS 62  BILITOT 0.9  PROT 7.6  ALBUMIN 4.0   No results for input(s): "LIPASE", "AMYLASE" in the last 168 hours. No results for input(s): "AMMONIA" in the last 168 hours.  ABG    Component Value Date/Time   PHART 7.45 07/08/2022 2308   PCO2ART 32 07/08/2022 2308   PO2ART 69 (L) 07/08/2022 2308   HCO3 22.2 07/08/2022 2308   ACIDBASEDEF 1.0 07/08/2022 2308   O2SAT 95.4 07/08/2022 2308     Coagulation Profile: No results for input(s): "INR", "PROTIME" in the last 168 hours.  Cardiac Enzymes: Recent Labs  Lab 07/08/22 2252  CKTOTAL 155    HbA1C: Hgb A1c MFr Bld  Date/Time Value Ref Range Status  09/26/2021 08:41 AM 6.4 4.6 - 6.5 % Final    Comment:    Glycemic Control Guidelines for People with Diabetes:Non Diabetic:  <6%Goal of Therapy: <7%Additional Action Suggested:  >8%   09/22/2020 01:42 PM 6.0 (H) <5.7 % of total Hgb Final    Comment:    For someone without known diabetes, a hemoglobin  A1c value between 5.7% and 6.4% is consistent  with prediabetes and should be confirmed with a  follow-up test. . For someone with known diabetes, a value <7% indicates that their diabetes is well controlled. A1c targets should be individualized based on duration of diabetes, age, comorbid conditions, and other considerations. . This assay result is consistent with an increased risk of diabetes. . Currently, no consensus exists regarding use of hemoglobin A1c for diagnosis of diabetes for children. .     CBG: No results for input(s): "GLUCAP" in the last 168 hours.  Review of Systems:   Review of Systems  Constitutional:  Negative for chills, fever, malaise/fatigue and weight loss.  HENT:  Negative for congestion, sinus pain and sore throat.   Eyes: Negative.   Respiratory:  Positive for cough, sputum production, shortness of breath and wheezing. Negative for hemoptysis.   Cardiovascular:  Negative for chest pain, palpitations, orthopnea, claudication and leg swelling.  Gastrointestinal:  Negative for abdominal pain, heartburn, nausea and vomiting.  Genitourinary: Negative.   Musculoskeletal:  Negative for joint pain and myalgias.  Skin:  Negative for rash.  Neurological:  Negative for weakness.  Endo/Heme/Allergies: Negative.   Psychiatric/Behavioral: Negative.     Past Medical History:  He,  has a past medical history of Asthma, Colon polyps, Diverticulosis, GERD (gastroesophageal reflux disease), Hyperlipidemia, and Hypertension.   Surgical History:   Past Surgical History:  Procedure Laterality Date   COLONOSCOPY  05/04/2017   per Dr.  Fuller Plan, adenomatous polyps, repeat in 5 yrs    RETINAL DETACHMENT REPAIR W/ SCLERAL BUCKLE LE  2012   per Dr. Starling Manns in [place] History:   reports that he has never smoked. He has never used smokeless tobacco. He reports current alcohol use of about 2.0 standard drinks of alcohol per week. He reports that he does not use drugs.   Family History:  His  family history includes Colon polyps in an other family member; Coronary artery disease in an other family member; Dementia in his mother. There is no history of Colon cancer, Esophageal cancer, Stomach cancer, or Rectal cancer.   Allergies No Known Allergies   Home Medications  Prior to Admission medications   Medication Sig Start Date End Date Taking? Authorizing Provider  albuterol (PROVENTIL) (2.5 MG/3ML) 0.083% nebulizer solution Take 3 mLs (2.5 mg total) by nebulization every 4 (four) hours as needed for wheezing or shortness of breath. 07/03/22  Yes Laurey Morale, MD  albuterol (VENTOLIN HFA) 108 (90 Base) MCG/ACT inhaler Inhale 2 puffs into the lungs every 4 (four) hours as needed for wheezing or shortness of breath. 07/03/22  Yes Laurey Morale, MD  HYDROcodone bit-homatropine (HYCODAN) 5-1.5 MG/5ML syrup Take 5 mLs by mouth every 4 (four) hours as needed for cough. 07/03/22  Yes Laurey Morale, MD  lisinopril-hydrochlorothiazide (ZESTORETIC) 10-12.5 MG tablet TAKE 1 TABLET BY MOUTH DAILY (MAX ON INS) 09/26/21  Yes Laurey Morale, MD  Mometasone Furo-Formoterol Fum [facility]) 50-5 MCG/ACT AERO Take 2 Inhalations by mouth in the morning and at bedtime. 07/07/22  Yes Laurey Morale, MD  montelukast (SINGULAIR) 10 MG tablet TAKE 1 TABLET AT BEDTIME BY MOUTH 09/26/21  Yes Laurey Morale, MD  pantoprazole (PROTONIX) 40 MG tablet Take 1 tablet (40 mg total) by mouth daily. 09/26/21  Yes Laurey Morale, MD  simvastatin (ZOCOR) 20 MG tablet Take 1 tablet (20 mg total) by mouth daily. 09/26/21  Yes Laurey Morale, MD     Critical care time: n/a    Freda Jackson, MD [facility] Office: [phone]   See Amion for personal pager PCCM on call pager [phone] until 7pm. Please call Elink 7p-7a. [phone]

## 2022-07-10 NOTE — Progress Notes (Signed)
OT Cancellation Note  Patient Details Name: Pilar Corrales MRN: [id number] DOB: [birth year]   Cancelled Treatment:    Reason Eval/Treat Not Completed: OT screened, no needs identified, will sign off. Per RN Pt has been going to and from bathroom independently, no questions or concerns about self-care skills at this time. OT will sign off. If concerns arise, please feel free to re-consult.   Merri Ray Kaseem Vastine 07/10/2022, 1:29 PM  [place] Office: [phone]

## 2022-07-11 DIAGNOSIS — B348 Other viral infections of unspecified site: Secondary | ICD-10-CM | POA: Diagnosis not present

## 2022-07-11 DIAGNOSIS — J189 Pneumonia, unspecified organism: Secondary | ICD-10-CM | POA: Diagnosis not present

## 2022-07-11 DIAGNOSIS — J9601 Acute respiratory failure with hypoxia: Secondary | ICD-10-CM | POA: Diagnosis not present

## 2022-07-11 LAB — CBC WITH DIFFERENTIAL/PLATELET
Abs Immature Granulocytes: 0.08 10*3/uL — ABNORMAL HIGH (ref 0.00–0.07)
Basophils Absolute: 0 10*3/uL (ref 0.0–0.1)
Basophils Relative: 0 %
Eosinophils Absolute: 0 10*3/uL (ref 0.0–0.5)
Eosinophils Relative: 0 %
HCT: 44.4 % (ref 39.0–52.0)
Hemoglobin: 14.3 g/dL (ref 13.0–17.0)
Immature Granulocytes: 1 %
Lymphocytes Relative: 15 %
Lymphs Abs: 2 10*3/uL (ref 0.7–4.0)
MCH: 29.9 pg (ref 26.0–34.0)
MCHC: 32.2 g/dL (ref 30.0–36.0)
MCV: 92.7 fL (ref 80.0–100.0)
Monocytes Absolute: 1.2 10*3/uL — ABNORMAL HIGH (ref 0.1–1.0)
Monocytes Relative: 9 %
Neutro Abs: 10.1 10*3/uL — ABNORMAL HIGH (ref 1.7–7.7)
Neutrophils Relative %: 75 %
Platelets: 298 10*3/uL (ref 150–400)
RBC: 4.79 MIL/uL (ref 4.22–5.81)
RDW: 13.1 % (ref 11.5–15.5)
WBC: 13.4 10*3/uL — ABNORMAL HIGH (ref 4.0–10.5)
nRBC: 0 % (ref 0.0–0.2)

## 2022-07-11 LAB — LEGIONELLA PNEUMOPHILA SEROGP 1 UR AG: L. pneumophila Serogp 1 Ur Ag: NEGATIVE

## 2022-07-11 LAB — BASIC METABOLIC PANEL
Anion gap: 7 (ref 5–15)
BUN: 17 mg/dL (ref 8–23)
CO2: 28 mmol/L (ref 22–32)
Calcium: 8.6 mg/dL — ABNORMAL LOW (ref 8.9–10.3)
Chloride: 105 mmol/L (ref 98–111)
Creatinine, Ser: 0.82 mg/dL (ref 0.61–1.24)
GFR, Estimated: >60 mL/min (ref >60)
Glucose, Bld: 104 mg/dL — ABNORMAL HIGH (ref 70–99)
Potassium: 4.2 mmol/L (ref 3.5–5.1)
Sodium: 140 mmol/L (ref 135–145)

## 2022-07-11 LAB — MAGNESIUM: Magnesium: 1.9 mg/dL (ref 1.7–2.4)

## 2022-07-11 MED ORDER — GUAIFENESIN-DM 100-10 MG/5ML PO SYRP
5.0000 mL | ORAL_SOLUTION | ORAL | Status: DC | PRN
  Administered 2022-07-11 – 2022-07-12 (×3): 5 mL via ORAL
  Filled 2022-07-11: qty 5
  Filled 2022-07-11: qty 10
  Filled 2022-07-11: qty 5

## 2022-07-11 MED ORDER — AZITHROMYCIN 250 MG PO TABS
500.0000 mg | ORAL_TABLET | Freq: Once | ORAL | Status: AC
  Administered 2022-07-11: 500 mg via ORAL
  Filled 2022-07-11: qty 2

## 2022-07-11 NOTE — Consult Note (Signed)
NAME:  Machael Raine, MRN:  [id number], DOB:  1956-05-12, LOS: 3 ADMISSION DATE:  07/08/2022, CONSULTATION DATE:  07/10/22 REFERRING MD:  Dwyane Dee, MD CHIEF COMPLAINT:  Pneumonia   History of Present Illness:  Deiontae Rabel is a 66 year old male with history of GERD, hyperlipidemia, hypertension and obesity who was admitted 07/08/22 for acute hypoxemic respiratory failure. He had covid 19 06/18/22 where he was treated with paxlovid. He continue to have progressive dyspnea, cough and nasal congestion. He was noted to have SpO2 in the 80s in the ER. He was started on supplemental oxygen requiring up to 15L. CTA PE study was negative for pulmonary emboli but notable for bronchial wall thickening, mucous plugging of the bilateral lower lobes/lingula with near occlusion of the left lower lobe bronchus. There is small solid 74m nodule in lingula and groundglass opacity 2cm in LUL.   He is being treated with ceftriaxone and azithromycin. He has been unable to produce a sputum sample for culture. Respiratory viral panel is positive for rhinovirus.   O2 requirements have started to wean down to 6L.   Patient is feeling better overall today. Is having trouble coughing out mucous. Denies any fevers, chills or nausea/vomiting.   PCCM consulted for respiratory failure due to viral pneumonia and need for possible bronchoscopy.   Pertinent  Medical History   Past Medical History:  Diagnosis Date   Asthma    Colon polyps    Diverticulosis    GERD (gastroesophageal reflux disease)    Hyperlipidemia    Hypertension    Significant Hospital Events: Including procedures, antibiotic start and stop dates in addition to other pertinent events   9/30 admitted to step down unit for respiratory failure, requiring 15L 10/2 PCCM consulted  Interim History / Subjective:   He has been weaned to 2L [place] Starting to cough up mucous   Objective   Blood pressure (!) 81/42, pulse 84, temperature 98.2 F  (36.8 C), temperature source Oral, resp. rate 17, height '5\' 9"'$  (1.753 m), weight 97.1 kg, SpO2 94 %.        Intake/Output Summary (Last 24 hours) at 07/11/2022 0924 Last data filed at 07/11/2022 0500 Gross per 24 hour  Intake 959.5 ml  Output --  Net 959.5 ml    Filed Weights   07/08/22 1508  Weight: 97.1 kg    Examination: General: obese male, no acute distress, sitting up on bedside HENT: /AT, moist mucosa Lungs: inspiratory and expiratory rhonchi, moving more air today Cardiovascular: rrr, s1s2, no murmurs Abdomen: soft, non-tender, non-distended, BS+ Extremities: warm, no edema Neuro: alert, moving all extremities GU: n/a  CTA Chest 07/08/22 1. No central pulmonary embolism. The segmental arteries in the lung bases are obscured by motion. 2. Bronchitis with mucous plugging and near-complete occlusion of the left lower lobe bronchus secondary to frothy mucous. Aspiration not excluded. 3. 2.2 cm (average dimension) ground-glass opacity in the left apex may be infectious/inflammatory however malignancy is not excluded.  Resolved Hospital Problem list     Assessment & Plan:  Acute Hypoxemic Respiratory Failure Respiratory Bronchiolitis  Sepsis due to viral pneumonia Mucous Plugging Pulmonary Nodule Lactic Acidosis  Plan: - Continue to wean supplemental O2 for goal SpO2 92% or greater - Steroid taper ordered - Continue hypertonic saline nebs BID and duonebs q6hrs for pulmonary hygiene - Continue flutter valve/IS - ok to complete 3 days of azithromycin '500mg'$  and 5 days of ceftriaxone - Will need follow up in pulmonary clinic for  repeat CT chest scan to follow up resolution of pneumonia and follow pulmonary nodules - No need for bronchoscopy at this time  PCCM will continue to follow  Best Practice (right click and "Reselect all SmartList Selections" daily)   Per [facility]  Labs   CBC: Recent Labs  Lab 07/08/22 1757 07/09/22 0736 07/10/22 0304 07/11/22 0316   WBC 11.8* 10.6* 19.0* 13.4*  NEUTROABS 9.0* 9.0* 17.1* 10.1*  HGB 16.4 15.4 15.1 14.3  HCT 48.4 45.6 46.4 44.4  MCV 89.1 88.9 92.2 92.7  PLT 353 368 316 298     Basic Metabolic Panel: Recent Labs  Lab 07/08/22 1757 07/08/22 2252 07/09/22 0736 07/10/22 0304 07/11/22 0316  NA 136  --  135 137 140  K 3.8  --  3.8 4.6 4.2  CL 99  --  102 102 105  CO2 25  --  '22 27 28  '$ GLUCOSE 104*  --  138* 151* 104*  BUN 12  --  '14 22 17  '$ CREATININE 0.83  --  0.71 1.00 0.82  CALCIUM 9.7  --  9.2 8.8* 8.6*  MG  --  2.1 2.1 2.2 1.9  PHOS  --  3.1 3.8  --   --     GFR: Estimated Creatinine Clearance: 104.7 mL/min (by C-G formula based on SCr of 0.82 mg/dL). Recent Labs  Lab 07/08/22 1757 07/08/22 2252 07/08/22 2352 07/09/22 0404 07/09/22 0735 07/09/22 0736 07/09/22 1221 07/10/22 0304 07/11/22 0316  PROCALCITON  --  <0.10  --   --   --  <0.10  --  <0.10  --   WBC 11.8*  --   --   --   --  10.6*  --  19.0* 13.4*  LATICACIDVEN  --   --  5.1* 3.3* 1.6  --  3.0*  --   --      Liver Function Tests: Recent Labs  Lab 07/09/22 0736  AST 23  ALT 30  ALKPHOS 62  BILITOT 0.9  PROT 7.6  ALBUMIN 4.0    No results for input(s): "LIPASE", "AMYLASE" in the last 168 hours. No results for input(s): "AMMONIA" in the last 168 hours.  ABG    Component Value Date/Time   PHART 7.45 07/08/2022 2308   PCO2ART 32 07/08/2022 2308   PO2ART 69 (L) 07/08/2022 2308   HCO3 22.2 07/08/2022 2308   ACIDBASEDEF 1.0 07/08/2022 2308   O2SAT 95.4 07/08/2022 2308     Coagulation Profile: No results for input(s): "INR", "PROTIME" in the last 168 hours.  Cardiac Enzymes: Recent Labs  Lab 07/08/22 2252  CKTOTAL 155     HbA1C: Hgb A1c MFr Bld  Date/Time Value Ref Range Status  09/26/2021 08:41 AM 6.4 4.6 - 6.5 % Final    Comment:    Glycemic Control Guidelines for People with Diabetes:Non Diabetic:  <6%Goal of Therapy: <7%Additional Action Suggested:  >8%   09/22/2020 01:42 PM 6.0 (H) <5.7 %  of total Hgb Final    Comment:    For someone without known diabetes, a hemoglobin  A1c value between 5.7% and 6.4% is consistent with prediabetes and should be confirmed with a  follow-up test. . For someone with known diabetes, a value <7% indicates that their diabetes is well controlled. A1c targets should be individualized based on duration of diabetes, age, comorbid conditions, and other considerations. . This assay result is consistent with an increased risk of diabetes. . Currently, no consensus exists regarding use of hemoglobin A1c for diagnosis of  diabetes for children. .     CBG: No results for input(s): "GLUCAP" in the last 168 hours.   Critical care time: n/a    Freda Jackson, MD [facility] Office: [phone]   See Amion for personal pager PCCM on call pager [phone] until 7pm. Please call Elink 7p-7a. [phone]

## 2022-07-11 NOTE — Progress Notes (Signed)
Progress Note    Marcus Grant   CXK:[id number]  DOB: 07/13/1956  DOA: 07/08/2022     3 PCP: Laurey Morale, MD  Initial CC: Cough, SOB  Hospital Course: [name] is a 66 yo male with PMH HTN, HLD, BPH, asthma, GERD who presented with worsening shortness of breath, cough, chest pain.  He was found to be hypoxic in the 80s requiring 5 L initially.  He was recently treated beginning of September for COVID with Paxlovid.  He was also treated with a Z-Pak after no major improvement. He has progressively had worsened nonproductive cough and shortness of breath.  He therefore presented to the ER for further evaluation. CXR was relatively unremarkable.  This was followed with a CTA chest which was negative for PE.  Did show bronchitis with mucous plugging and near complete occlusion of the left lower lobe.  2.2 cm groundglass opacity noted in the left apex with concern for infectious versus inflammatory vs malignant etiology.  Repeat CT was recommended in approximately 6 months. He was started on Rocephin and azithromycin and admitted for further work-up.  Interval History:  No events overnight.  Breathing still improving.  Overall feels better.  Assessment and Plan: * CAP (community acquired pneumonia) - CTA chest shows bronchitis with mucous plugging and near complete occlusion of left lower lobe -No significant risk factors for MDRO at this time - Continue Rocephin (complete 5 days) and azithromycin (complete 3 days) - Start steroids given significant hypoxia on admission -Trend procalcitonin (neg x 3) -Sputum culture if able; follow-up blood cultures - RVP positive for rhinovirus -Strep pneumo urinary antigen negative, follow-up Legionella -Appreciate pulmonology evaluation.  No need for bronchoscopy given underlying mucous plugging but will need outpatient follow-up for repeat CT chest  Rhinovirus infection - Continue droplet precautions - Continue supportive care  Acute  respiratory failure with hypoxia ([place]) - Due to underlying pneumonia -Continue oxygen and wean as able - Continue steroids  Severe sepsis (HCC) - Tachycardia, tachypnea, lactic acidosis.  Presumed lung source.  Associated respiratory failure -See pneumonia treatment and work-up  Essential hypertension - Continue lisinopril-HCTZ  Dyslipidemia - Continue Zocor   Old records reviewed in assessment of this patient  Antimicrobials: Rocephin 07/08/2022 >> current Azithromycin 07/09/2022 >> current  DVT prophylaxis:  SCDs Start: 07/08/22 2350   Code Status:   Code Status: Full Code  Mobility Assessment (last 72 hours)     Mobility Assessment     Row Name 07/11/22 0800 07/10/22 1518         What is the highest level of mobility based on the progressive mobility assessment? Level 6 (Walks independently in room and hall) - Balance while walking in room without assist - Complete Level 6 (Walks independently in room and hall) - Balance while walking in room without assist - Complete               Barriers to discharge: None Disposition Plan: Home Status is: Inpatient  Objective: Blood pressure 128/82, pulse (!) 101, temperature 98.1 F (36.7 C), temperature source Oral, resp. rate 20, height '5\' 9"'$  (1.753 m), weight 97.1 kg, SpO2 94 %.  Examination:  Physical Exam Constitutional:      General: He is not in acute distress.    Appearance: Normal appearance.  HENT:     Head: Normocephalic and atraumatic.     Mouth/Throat:     Mouth: Mucous membranes are moist.  Eyes:     Extraocular Movements: Extraocular movements intact.  Cardiovascular:     Rate and Rhythm: Normal rate and regular rhythm.     Heart sounds: Normal heart sounds.  Pulmonary:     Effort: Pulmonary effort is normal. No respiratory distress.     Breath sounds: Rhonchi and rales present. No wheezing.  Abdominal:     General: Bowel sounds are normal. There is no distension.     Palpations: Abdomen is  soft.     Tenderness: There is no abdominal tenderness.  Musculoskeletal:        General: Normal range of motion.     Cervical back: Normal range of motion and neck supple.  Skin:    General: Skin is warm and dry.  Neurological:     General: No focal deficit present.     Mental Status: He is alert.  Psychiatric:        Mood and Affect: Mood normal.        Behavior: Behavior normal.      Consultants:  Pulmonology   Procedures:    Data Reviewed: Results for orders placed or performed during the hospital encounter of 07/08/22 (from the past 24 hour(s))  Basic metabolic panel     Status: Abnormal   Collection Time: 07/11/22  3:16 AM  Result Value Ref Range   Sodium 140 135 - 145 mmol/L   Potassium 4.2 3.5 - 5.1 mmol/L   Chloride 105 98 - 111 mmol/L   CO2 28 22 - 32 mmol/L   Glucose, Bld 104 (H) 70 - 99 mg/dL   BUN 17 8 - 23 mg/dL   Creatinine, Ser 0.82 0.61 - 1.24 mg/dL   Calcium 8.6 (L) 8.9 - 10.3 mg/dL   GFR, Estimated >60 >60 mL/min   Anion gap 7 5 - 15  CBC with Differential/Platelet     Status: Abnormal   Collection Time: 07/11/22  3:16 AM  Result Value Ref Range   WBC 13.4 (H) 4.0 - 10.5 K/uL   RBC 4.79 4.22 - 5.81 MIL/uL   Hemoglobin 14.3 13.0 - 17.0 g/dL   HCT 44.4 39.0 - 52.0 %   MCV 92.7 80.0 - 100.0 fL   MCH 29.9 26.0 - 34.0 pg   MCHC 32.2 30.0 - 36.0 g/dL   RDW 13.1 11.5 - 15.5 %   Platelets 298 150 - 400 K/uL   nRBC 0.0 0.0 - 0.2 %   Neutrophils Relative % 75 %   Neutro Abs 10.1 (H) 1.7 - 7.7 K/uL   Lymphocytes Relative 15 %   Lymphs Abs 2.0 0.7 - 4.0 K/uL   Monocytes Relative 9 %   Monocytes Absolute 1.2 (H) 0.1 - 1.0 K/uL   Eosinophils Relative 0 %   Eosinophils Absolute 0.0 0.0 - 0.5 K/uL   Basophils Relative 0 %   Basophils Absolute 0.0 0.0 - 0.1 K/uL   Immature Granulocytes 1 %   Abs Immature Granulocytes 0.08 (H) 0.00 - 0.07 K/uL  Magnesium     Status: None   Collection Time: 07/11/22  3:16 AM  Result Value Ref Range   Magnesium 1.9 1.7  - 2.4 mg/dL    I have Reviewed nursing notes, Vitals, and Lab results since pt's last encounter. Pertinent lab results : see above I have ordered test including BMP, CBC, Mg I have reviewed the last note from staff over past 24 hours I have discussed pt's care plan and test results with nursing staff, case manager   LOS: 3 days   Dwyane Dee, MD [facility]  07/11/2022, 1:52 PM

## 2022-07-12 ENCOUNTER — Telehealth: Payer: Self-pay | Admitting: Pulmonary Disease

## 2022-07-12 DIAGNOSIS — J9601 Acute respiratory failure with hypoxia: Secondary | ICD-10-CM | POA: Diagnosis not present

## 2022-07-12 DIAGNOSIS — J189 Pneumonia, unspecified organism: Secondary | ICD-10-CM | POA: Diagnosis not present

## 2022-07-12 LAB — CBC WITH DIFFERENTIAL/PLATELET
Abs Immature Granulocytes: 0.06 10*3/uL (ref 0.00–0.07)
Basophils Absolute: 0 10*3/uL (ref 0.0–0.1)
Basophils Relative: 0 %
Eosinophils Absolute: 0 10*3/uL (ref 0.0–0.5)
Eosinophils Relative: 0 %
HCT: 45.5 % (ref 39.0–52.0)
Hemoglobin: 15 g/dL (ref 13.0–17.0)
Immature Granulocytes: 1 %
Lymphocytes Relative: 22 %
Lymphs Abs: 2.4 10*3/uL (ref 0.7–4.0)
MCH: 30.2 pg (ref 26.0–34.0)
MCHC: 33 g/dL (ref 30.0–36.0)
MCV: 91.7 fL (ref 80.0–100.0)
Monocytes Absolute: 0.9 10*3/uL (ref 0.1–1.0)
Monocytes Relative: 9 %
Neutro Abs: 7.2 10*3/uL (ref 1.7–7.7)
Neutrophils Relative %: 68 %
Platelets: 325 10*3/uL (ref 150–400)
RBC: 4.96 MIL/uL (ref 4.22–5.81)
RDW: 13.2 % (ref 11.5–15.5)
WBC: 10.5 10*3/uL (ref 4.0–10.5)
nRBC: 0 % (ref 0.0–0.2)

## 2022-07-12 LAB — MAGNESIUM: Magnesium: 2.1 mg/dL (ref 1.7–2.4)

## 2022-07-12 LAB — BASIC METABOLIC PANEL
Anion gap: 6 (ref 5–15)
BUN: 31 mg/dL — ABNORMAL HIGH (ref 8–23)
CO2: 24 mmol/L (ref 22–32)
Calcium: 8.6 mg/dL — ABNORMAL LOW (ref 8.9–10.3)
Chloride: 108 mmol/L (ref 98–111)
Creatinine, Ser: 1.05 mg/dL (ref 0.61–1.24)
GFR, Estimated: >60 mL/min (ref >60)
Glucose, Bld: 121 mg/dL — ABNORMAL HIGH (ref 70–99)
Potassium: 4.2 mmol/L (ref 3.5–5.1)
Sodium: 138 mmol/L (ref 135–145)

## 2022-07-12 MED ORDER — IPRATROPIUM-ALBUTEROL 0.5-2.5 (3) MG/3ML IN SOLN
3.0000 mL | Freq: Three times a day (TID) | RESPIRATORY_TRACT | Status: DC
  Administered 2022-07-13: 3 mL via RESPIRATORY_TRACT
  Filled 2022-07-12: qty 3

## 2022-07-12 NOTE — Progress Notes (Signed)
PROGRESS NOTE Marcus Grant  GGE:[id number] DOB: November 18, 1955 DOA: 07/08/2022 PCP: Laurey Morale, MD   Brief Narrative/Hospital Course: 66 yo male with PMH HTN, HLD, BPH, asthma, GERD who presented with worsening shortness of breath, cough, chest pain.  He was found to be hypoxic in the 80s requiring 5 L initially.  He was recently treated beginning of September for COVID with Paxlovid.  He was also treated with a Z-Pak after no major improvement.He has progressively had worsened nonproductive cough and shortness of breath.  He therefore presented to the ER for further evaluation. CXR was relatively unremarkable.  This was followed with a CTA chest which was negative for PE.  Did show bronchitis with mucous plugging and near complete occlusion of the left lower lobe.  2.2 cm groundglass opacity noted in the left apex with concern for infectious versus inflammatory vs malignant etiology.  Repeat CT was recommended in approximately 6 months. He was started on Rocephin and azithromycin and admitted for further work-up. Patient was treated for acute respiratory failure RVP positive for enterovirus initially on oxygen D1 on admission seen by pulmonary overall improving.  Labs with improved leukocytosis, now on room air    Subjective: Seen examined this morning complains of ongoing wheezing slightly hypoxic at 10% on room air this morning.  Nonproductive cough, wife at the bedside overall improving every day  Assessment and Plan: Principal Problem:   CAP (community acquired pneumonia) Active Problems:   Acute respiratory failure with hypoxia (HCC)   Rhinovirus infection   Severe sepsis ([place])   Dyslipidemia   Essential hypertension  Severe sepsis due to pneumonia and rhinovirus infection Acute respiratory failure with hypoxia Community-acquired pneumonia Rhinovirus infection: Prolonged course with ongoing wheezing, initially on high oxygen need.  Seen by pulmonary. Has had extensive work-up  with CTA chest-shows bronchitis with mucous plugging and near complete occlusion of left lower lobe.  Being managed with IV antibiotics, steroids.  Procalcitonin negative, sputum culture not drawn urinary antigen negative RVP positive for rhinovirus.  Follow pulmonary wean off to room air, hopefully can discharge home tomorrow.  Continue droplet precaution  Essential hypertension: Controlled on lisinopril-HCTZ Dyslipidemia continue Zocor.   Class I Obesity:Patient's Body mass index is 31.6 kg/m. : Will benefit with PCP follow-up, weight loss  healthy lifestyle and outpatient sleep evaluation.  DVT prophylaxis: SCDs Start: 07/08/22 2350 Code Status:   Code Status: Full Code Family Communication: plan of care discussed with patient/wife at bedside. Patient status is: Inpatient because of pneumonia continued wheezing Level of care: Telemetry   Dispo: The patient is from: home            Anticipated disposition: home  Mobility Assessment (last 72 hours)     Mobility Assessment     Row Name 07/12/22 0843 07/12/22 0300 07/11/22 0800 07/10/22 1518     Does patient have an order for bedrest or is patient medically unstable No - Continue assessment No - Continue assessment -- --    What is the highest level of mobility based on the progressive mobility assessment? Level 6 (Walks independently in room and hall) - Balance while walking in room without assist - Complete Level 6 (Walks independently in room and hall) - Balance while walking in room without assist - Complete Level 6 (Walks independently in room and hall) - Balance while walking in room without assist - Complete Level 6 (Walks independently in room and hall) - Balance while walking in room without assist - Complete  Objective: Vitals last 24 hrs: Vitals:   07/11/22 2032 07/12/22 0114 07/12/22 0359 07/12/22 0724  BP: 129/78  (!) 127/90   Pulse: (!) 105  92   Resp: '20  20 20  '$ Temp: 98.8 F (37.1 C)  97.9 F (36.6  C)   TempSrc: Oral  Oral   SpO2: 95% 94% 94% 96%  Weight:      Height:       Weight change:   Physical Examination: General exam: alert awake, on room air, pleasant and HEENT:Oral mucosa moist, Ear/Nose WNL grossly, dentition normal. Respiratory system: bilaterally diminished BS w/ wheezing, no use of accessory muscle Cardiovascular system: S1 & S2 +, No JVD. Gastrointestinal system: Abdomen soft,NT,ND, BS+ Nervous System:Alert, awake, moving extremities and grossly nonfocal Extremities: LE edema neg,distal peripheral pulses palpable.  Skin: No rashes,no icterus. MSK: Normal muscle bulk,tone, power  Medications reviewed:  Scheduled Meds:  fluticasone  2 spray Each Nare Daily   guaiFENesin  1,200 mg Oral BID   lisinopril  10 mg Oral Daily   And   hydrochlorothiazide  12.5 mg Oral Daily   ipratropium-albuterol  3 mL Nebulization Q6H   montelukast  10 mg Oral QHS   pantoprazole  40 mg Oral Daily   [START ON 07/13/2022] predniSONE  30 mg Oral Q breakfast   Followed by   Derrill Memo ON 07/17/2022] predniSONE  20 mg Oral Q breakfast   Followed by   Derrill Memo ON 07/21/2022] predniSONE  10 mg Oral Q breakfast   Followed by   Derrill Memo ON 07/25/2022] predniSONE  5 mg Oral Q breakfast   pseudoephedrine  60 mg Oral BID WC   simvastatin  20 mg Oral Daily   sodium chloride HYPERTONIC  4 mL Nebulization BID   Continuous Infusions:  sodium chloride Stopped (07/11/22 0006)   cefTRIAXone (ROCEPHIN)  IV Stopped (07/11/22 2248)      Diet Order             Diet regular Room service appropriate? Yes; Fluid consistency: Thin  Diet effective now                            Intake/Output Summary (Last 24 hours) at 07/12/2022 1149 Last data filed at 07/12/2022 0837 Gross per 24 hour  Intake 340 ml  Output --  Net 340 ml   Net IO Since Admission: 950.61 mL [07/12/22 1149]  Wt Readings from Last 3 Encounters:  07/08/22 97.1 kg  07/03/22 98 kg  06/21/22 95.3 kg     Unresulted Labs  (From admission, onward)     Start     Ordered   07/10/22 1130  Expectorated Sputum Assessment w Gram Stain, Rflx to Resp Cult  Once,   R        07/10/22 1130   07/10/22 7902  Basic metabolic panel  Daily at 5am,   R     Question:  Specimen collection method  Answer:  Lab=Lab collect   07/09/22 1617   07/10/22 0500  CBC with Differential/Platelet  Daily at 5am,   R     Question:  Specimen collection method  Answer:  Lab=Lab collect   07/09/22 1617   07/10/22 0500  Magnesium  Daily at 5am,   R     Question:  Specimen collection method  Answer:  Lab=Lab collect   07/09/22 1617   07/09/22 1210  MRSA Next Gen by PCR, Nasal  Once,   R  07/09/22 1210   07/08/22 2254  Expectorated Sputum Assessment w Gram Stain, Rflx to Resp Cult  Once,   R        07/08/22 2254          Data Reviewed: I have personally reviewed following labs and imaging studies CBC: Recent Labs  Lab 07/08/22 1757 07/09/22 0736 07/10/22 0304 07/11/22 0316 07/12/22 0811  WBC 11.8* 10.6* 19.0* 13.4* 10.5  NEUTROABS 9.0* 9.0* 17.1* 10.1* 7.2  HGB 16.4 15.4 15.1 14.3 15.0  HCT 48.4 45.6 46.4 44.4 45.5  MCV 89.1 88.9 92.2 92.7 91.7  PLT 353 368 316 298 008   Basic Metabolic Panel: Recent Labs  Lab 07/08/22 1757 07/08/22 2252 07/09/22 0736 07/10/22 0304 07/11/22 0316 07/12/22 0609  NA 136  --  135 137 140 138  K 3.8  --  3.8 4.6 4.2 4.2  CL 99  --  102 102 105 108  CO2 25  --  '22 27 28 24  '$ GLUCOSE 104*  --  138* 151* 104* 121*  BUN 12  --  '14 22 17 '$ 31*  CREATININE 0.83  --  0.71 1.00 0.82 1.05  CALCIUM 9.7  --  9.2 8.8* 8.6* 8.6*  MG  --  2.1 2.1 2.2 1.9 2.1  PHOS  --  3.1 3.8  --   --   --    GFR: Estimated Creatinine Clearance: 81.7 mL/min (by C-G formula based on SCr of 1.05 mg/dL). Liver Function Tests: Recent Labs  Lab 07/09/22 0736  AST 23  ALT 30  ALKPHOS 62  BILITOT 0.9  PROT 7.6  ALBUMIN 4.0   No results for input(s): "LIPASE", "AMYLASE" in the last 168 hours. No results for  input(s): "AMMONIA" in the last 168 hours. Coagulation Profile: No results for input(s): "INR", "PROTIME" in the last 168 hours. BNP (last 3 results) No results for input(s): "PROBNP" in the last 8760 hours. HbA1C: No results for input(s): "HGBA1C" in the last 72 hours. CBG: No results for input(s): "GLUCAP" in the last 168 hours. Lipid Profile: No results for input(s): "CHOL", "HDL", "LDLCALC", "TRIG", "CHOLHDL", "LDLDIRECT" in the last 72 hours. Thyroid Function Tests: No results for input(s): "TSH", "T4TOTAL", "FREET4", "T3FREE", "THYROIDAB" in the last 72 hours. Sepsis Labs: Recent Labs  Lab 07/08/22 2252 07/08/22 2352 07/09/22 0404 07/09/22 0735 07/09/22 0736 07/09/22 1221 07/10/22 0304  PROCALCITON <0.10  --   --   --  <0.10  --  <0.10  LATICACIDVEN  --  5.1* 3.3* 1.6  --  3.0*  --     Recent Results (from the past 240 hour(s))  Culture, blood (routine x 2) Call MD if unable to obtain prior to antibiotics being given     Status: None (Preliminary result)   Collection Time: 07/09/22  3:23 AM   Specimen: BLOOD LEFT HAND  Result Value Ref Range Status   Specimen Description   Final    BLOOD LEFT HAND Performed at [facility], [place] [address], [place], [place] [address]    Special Requests   Final    BOTTLES DRAWN AEROBIC AND ANAEROBIC Blood Culture adequate volume Performed at [place] [address], [place], [place] [address]    Culture   Final    NO GROWTH 3 DAYS Performed at [facility], [place] [address] [place], [place] [address]    Report Status PENDING  Incomplete  Culture, blood (routine x 2) Call MD if unable to obtain prior  to antibiotics being given     Status: None (Preliminary result)   Collection Time: 07/09/22 12:21 PM   Specimen: BLOOD  Result Value Ref Range Status   Specimen Description   Final    BLOOD LEFT ANTECUBITAL Performed at [place] [address]  [place], [place] [address]    Special Requests   Final    BOTTLES DRAWN AEROBIC ONLY Blood Culture adequate volume Performed at [place] [address] [place], [place] [address]    Culture   Final    NO GROWTH 3 DAYS Performed at [facility], [place] [address], [place], [place] [address]    Report Status PENDING  Incomplete  MRSA Next Gen by PCR, Nasal     Status: None   Collection Time: 07/09/22 12:45 PM   Specimen: Nasal Mucosa; Nasal Swab  Result Value Ref Range Status   MRSA by PCR Next Gen NOT DETECTED NOT DETECTED Final    Comment: (NOTE) The GeneXpert MRSA Assay (FDA approved for NASAL specimens only), is one component of a comprehensive MRSA colonization surveillance program. It is not intended to diagnose MRSA infection nor to guide or monitor treatment for MRSA infections. Test performance is not FDA approved in patients less than 20 years old. Performed at [facility], [place] [address], [place], [place] [address]   Respiratory (~20 pathogens) panel by PCR     Status: Abnormal   Collection Time: 07/09/22  1:46 PM   Specimen: Nasopharyngeal Swab; Respiratory  Result Value Ref Range Status   Adenovirus NOT DETECTED NOT DETECTED Final   Coronavirus 229E NOT DETECTED NOT DETECTED Final    Comment: (NOTE) The Coronavirus on the Respiratory Panel, DOES NOT test for the novel  Coronavirus (2019 nCoV)    Coronavirus HKU1 NOT DETECTED NOT DETECTED Final   Coronavirus NL63 NOT DETECTED NOT DETECTED Final   Coronavirus OC43 NOT DETECTED NOT DETECTED Final   Metapneumovirus NOT DETECTED NOT DETECTED Final   Rhinovirus / Enterovirus DETECTED (A) NOT DETECTED Final   Influenza A NOT DETECTED NOT DETECTED Final   Influenza B NOT DETECTED NOT DETECTED Final   Parainfluenza Virus 1 NOT DETECTED NOT DETECTED Final   Parainfluenza Virus 2 NOT DETECTED NOT DETECTED Final   Parainfluenza Virus 3 NOT DETECTED NOT DETECTED Final    Parainfluenza Virus 4 NOT DETECTED NOT DETECTED Final   Respiratory Syncytial Virus NOT DETECTED NOT DETECTED Final   Bordetella pertussis NOT DETECTED NOT DETECTED Final   Bordetella Parapertussis NOT DETECTED NOT DETECTED Final   Chlamydophila pneumoniae NOT DETECTED NOT DETECTED Final   Mycoplasma pneumoniae NOT DETECTED NOT DETECTED Final    Comment: Performed at [facility], [place]. [address] [place], [place] [address]  Expectorated Sputum Assessment w Gram Stain, Rflx to Resp Cult     Status: None   Collection Time: 07/10/22  9:49 AM   Specimen: Sputum  Result Value Ref Range Status   Specimen Description SPUTUM  Final   Special Requests SPUTUM  Final   Sputum evaluation   Final    Sputum specimen not acceptable for testing.  Please recollect.   CALLED TO CINDY @ 1040 07/10/22. GILBERT, L Performed at [facility], [place] [address] [place], [place] [address]    Report Status 07/10/2022 FINAL  Final    Antimicrobials: Anti-infectives (From admission, onward)    Start     Dose/Rate Route Frequency Ordered Stop   07/11/22  1600  azithromycin (ZITHROMAX) tablet 500 mg        500 mg Oral  Once 07/11/22 0706 07/11/22 1856   07/09/22 2200  cefTRIAXone (ROCEPHIN) 2 g in sodium chloride 0.9 % 100 mL IVPB        2 g 200 mL/hr over 30 Minutes Intravenous Every 24 hours 07/08/22 2254 07/14/22 2159   07/08/22 2300  azithromycin (ZITHROMAX) 500 mg in sodium chloride 0.9 % 250 mL IVPB  Status:  Discontinued        500 mg 250 mL/hr over 60 Minutes Intravenous Every 24 hours 07/08/22 2254 07/08/22 2256   07/08/22 2230  azithromycin (ZITHROMAX) 500 mg in sodium chloride 0.9 % 250 mL IVPB  Status:  Discontinued        500 mg 250 mL/hr over 60 Minutes Intravenous Every 24 hours 07/08/22 2229 07/11/22 0706   07/08/22 2230  cefTRIAXone (ROCEPHIN) 1 g in sodium chloride 0.9 % 100 mL IVPB        1 g 200 mL/hr over 30 Minutes Intravenous  Once 07/08/22 2229 07/09/22 0025       Culture/Microbiology    Component Value Date/Time   SDES SPUTUM 07/10/2022 [place] 07/10/2022 0949   CULT  07/09/2022 1221    NO GROWTH 3 DAYS Performed at [facility], [place] [address] [place], [place] [address]    REPTSTATUS 07/10/2022 FINAL 07/10/2022 6644    Other culture-see note  Radiology Studies: No results found.   LOS: 4 days   Antonieta Pert, MD [facility]  07/12/2022, 11:49 AM

## 2022-07-12 NOTE — Progress Notes (Signed)
NAME:  Marcus Grant, MRN:  [id number], DOB:  1956-03-10, LOS: 4 ADMISSION DATE:  07/08/2022, CONSULTATION DATE:  07/10/22 REFERRING MD:  Dwyane Dee, MD CHIEF COMPLAINT:  Pneumonia   History of Present Illness:  Marcus Grant is a 66 year old male with history of GERD, hyperlipidemia, hypertension and obesity who was admitted 07/08/22 for acute hypoxemic respiratory failure. He had covid 19 06/18/22 where he was treated with paxlovid. He continue to have progressive dyspnea, cough and nasal congestion. He was noted to have SpO2 in the 80s in the ER. He was started on supplemental oxygen requiring up to 15L. CTA PE study was negative for pulmonary emboli but notable for bronchial wall thickening, mucous plugging of the bilateral lower lobes/lingula with near occlusion of the left lower lobe bronchus. There is small solid 57m nodule in lingula and groundglass opacity 2cm in LUL.   He is being treated with ceftriaxone and azithromycin. He has been unable to produce a sputum sample for culture. Respiratory viral panel is positive for rhinovirus.   O2 requirements have started to wean down to 6L.   Patient is feeling better overall today. Is having trouble coughing out mucous. Denies any fevers, chills or nausea/vomiting.   PCCM consulted for respiratory failure due to viral pneumonia and need for possible bronchoscopy.   Pertinent  Medical History   Past Medical History:  Diagnosis Date   Asthma    Colon polyps    Diverticulosis    GERD (gastroesophageal reflux disease)    Hyperlipidemia    Hypertension    Significant Hospital Events: Including procedures, antibiotic start and stop dates in addition to other pertinent events   9/30 admitted to step down unit for respiratory failure, requiring 15L 10/2 PCCM consulted  Interim History / Subjective:   He has been weaned off oxygen He is feeling better today, continues to have productive cough   Objective   Blood pressure (!)  127/90, pulse 92, temperature 97.9 F (36.6 C), temperature source Oral, resp. rate 20, height '5\' 9"'$  (1.753 m), weight 97.1 kg, SpO2 96 %.       No intake or output data in the 24 hours ending 07/12/22 0808 Filed Weights   07/08/22 1508  Weight: 97.1 kg    Examination: General: obese male, no acute distress, sitting up in chair HENT: [place]/AT, moist mucosa Lungs: expiratory rhonchi, improved air movement  Cardiovascular: rrr, s1s2, no murmurs Abdomen: soft, non-tender, non-distended, BS+ Extremities: warm, no edema Neuro: alert, moving all extremities GU: n/a  CTA Chest 07/08/22 1. No central pulmonary embolism. The segmental arteries in the lung bases are obscured by motion. 2. Bronchitis with mucous plugging and near-complete occlusion of the left lower lobe bronchus secondary to frothy mucous. Aspiration not excluded. 3. 2.2 cm (average dimension) ground-glass opacity in the left apex may be infectious/inflammatory however malignancy is not excluded.  Resolved Hospital Problem list     Assessment & Plan:  Acute Hypoxemic Respiratory Failure Respiratory Bronchiolitis  Sepsis due to viral pneumonia Mucous Plugging Pulmonary Nodule Lactic Acidosis  Plan: - Obtain ambulatory SpO2, he has been weaned off O2 at rest - Steroid taper ordered - Continue hypertonic saline nebs BID and duonebs q6hrs for pulmonary hygiene. He will need prescriptions for these medications at discharge to use at home. - Continue flutter valve/IS - ok to complete 3 days of azithromycin '500mg'$  and 5 days of ceftriaxone - Will need follow up in pulmonary clinic for repeat CT chest scan to follow  up resolution of pneumonia and follow pulmonary nodules - No need for bronchoscopy at this time  PCCM will sign. I will arrange follow up in our clinic in 1 month.  Best Practice (right click and "Reselect all SmartList Selections" daily)   Per [facility]  Labs   CBC: Recent Labs  Lab 07/08/22 1757  07/09/22 0736 07/10/22 0304 07/11/22 0316  WBC 11.8* 10.6* 19.0* 13.4*  NEUTROABS 9.0* 9.0* 17.1* 10.1*  HGB 16.4 15.4 15.1 14.3  HCT 48.4 45.6 46.4 44.4  MCV 89.1 88.9 92.2 92.7  PLT 353 368 316 431    Basic Metabolic Panel: Recent Labs  Lab 07/08/22 1757 07/08/22 2252 07/09/22 0736 07/10/22 0304 07/11/22 0316 07/12/22 0609  NA 136  --  135 137 140 138  K 3.8  --  3.8 4.6 4.2 4.2  CL 99  --  102 102 105 108  CO2 25  --  '22 27 28 24  '$ GLUCOSE 104*  --  138* 151* 104* 121*  BUN 12  --  '14 22 17 '$ 31*  CREATININE 0.83  --  0.71 1.00 0.82 1.05  CALCIUM 9.7  --  9.2 8.8* 8.6* 8.6*  MG  --  2.1 2.1 2.2 1.9 2.1  PHOS  --  3.1 3.8  --   --   --    GFR: Estimated Creatinine Clearance: 81.7 mL/min (by C-G formula based on SCr of 1.05 mg/dL). Recent Labs  Lab 07/08/22 1757 07/08/22 2252 07/08/22 2352 07/09/22 0404 07/09/22 0735 07/09/22 0736 07/09/22 1221 07/10/22 0304 07/11/22 0316  PROCALCITON  --  <0.10  --   --   --  <0.10  --  <0.10  --   WBC 11.8*  --   --   --   --  10.6*  --  19.0* 13.4*  LATICACIDVEN  --   --  5.1* 3.3* 1.6  --  3.0*  --   --     Liver Function Tests: Recent Labs  Lab 07/09/22 0736  AST 23  ALT 30  ALKPHOS 62  BILITOT 0.9  PROT 7.6  ALBUMIN 4.0   No results for input(s): "LIPASE", "AMYLASE" in the last 168 hours. No results for input(s): "AMMONIA" in the last 168 hours.  ABG    Component Value Date/Time   PHART 7.45 07/08/2022 2308   PCO2ART 32 07/08/2022 2308   PO2ART 69 (L) 07/08/2022 2308   HCO3 22.2 07/08/2022 2308   ACIDBASEDEF 1.0 07/08/2022 2308   O2SAT 95.4 07/08/2022 2308     Coagulation Profile: No results for input(s): "INR", "PROTIME" in the last 168 hours.  Cardiac Enzymes: Recent Labs  Lab 07/08/22 2252  CKTOTAL 155    HbA1C: Hgb A1c MFr Bld  Date/Time Value Ref Range Status  09/26/2021 08:41 AM 6.4 4.6 - 6.5 % Final    Comment:    Glycemic Control Guidelines for People with Diabetes:Non Diabetic:   <6%Goal of Therapy: <7%Additional Action Suggested:  >8%   09/22/2020 01:42 PM 6.0 (H) <5.7 % of total Hgb Final    Comment:    For someone without known diabetes, a hemoglobin  A1c value between 5.7% and 6.4% is consistent with prediabetes and should be confirmed with a  follow-up test. . For someone with known diabetes, a value <7% indicates that their diabetes is well controlled. A1c targets should be individualized based on duration of diabetes, age, comorbid conditions, and other considerations. . This assay result is consistent with an increased risk of diabetes. Marland Kitchen  Currently, no consensus exists regarding use of hemoglobin A1c for diagnosis of diabetes for children. .     CBG: No results for input(s): "GLUCAP" in the last 168 hours.   Critical care time: n/a    Freda Jackson, MD [facility] Office: [phone]   See Amion for personal pager PCCM on call pager [phone] until 7pm. Please call Elink 7p-7a. [phone]

## 2022-07-12 NOTE — Telephone Encounter (Signed)
Patient scheduled 08/03/2022 at 3:15pm with JD- reminder mailed to address on file. Nothing further needed.

## 2022-07-12 NOTE — Progress Notes (Addendum)
  Transition of Care [facility]) Screening Note   Patient Details  Name: Marcus Grant Date of Birth: Aug 06, 1956   Transition of Care [facility]) CM/SW Contact:    Vassie Moselle, LCSW Phone Number: 07/12/2022, 9:09 AM  TOC consulted for HH/DME needs. No HH/DME needs identified.   Transition of Care Department [facility]) has reviewed patient and no TOC needs have been identified at this time. We will continue to monitor patient advancement through interdisciplinary progression rounds. If new patient transition needs arise, please place a TOC consult.

## 2022-07-12 NOTE — Telephone Encounter (Signed)
Please schedule for follow up visit in 3-4 weeks with me for viral pneumonia and respiratory failure.  Thanks, JD

## 2022-07-13 ENCOUNTER — Telehealth: Payer: Self-pay

## 2022-07-13 DIAGNOSIS — J189 Pneumonia, unspecified organism: Secondary | ICD-10-CM | POA: Diagnosis not present

## 2022-07-13 LAB — CBC WITH DIFFERENTIAL/PLATELET
Abs Immature Granulocytes: 0.07 10*3/uL (ref 0.00–0.07)
Basophils Absolute: 0 10*3/uL (ref 0.0–0.1)
Basophils Relative: 0 %
Eosinophils Absolute: 0.1 10*3/uL (ref 0.0–0.5)
Eosinophils Relative: 1 %
HCT: 43.7 % (ref 39.0–52.0)
Hemoglobin: 14.4 g/dL (ref 13.0–17.0)
Immature Granulocytes: 1 %
Lymphocytes Relative: 28 %
Lymphs Abs: 2.5 10*3/uL (ref 0.7–4.0)
MCH: 30.1 pg (ref 26.0–34.0)
MCHC: 33 g/dL (ref 30.0–36.0)
MCV: 91.2 fL (ref 80.0–100.0)
Monocytes Absolute: 0.8 10*3/uL (ref 0.1–1.0)
Monocytes Relative: 9 %
Neutro Abs: 5.7 10*3/uL (ref 1.7–7.7)
Neutrophils Relative %: 61 %
Platelets: 286 10*3/uL (ref 150–400)
RBC: 4.79 MIL/uL (ref 4.22–5.81)
RDW: 13 % (ref 11.5–15.5)
WBC: 9.2 10*3/uL (ref 4.0–10.5)
nRBC: 0 % (ref 0.0–0.2)

## 2022-07-13 LAB — BASIC METABOLIC PANEL
Anion gap: 10 (ref 5–15)
BUN: 16 mg/dL (ref 8–23)
CO2: 26 mmol/L (ref 22–32)
Calcium: 8.8 mg/dL — ABNORMAL LOW (ref 8.9–10.3)
Chloride: 103 mmol/L (ref 98–111)
Creatinine, Ser: 0.77 mg/dL (ref 0.61–1.24)
GFR, Estimated: >60 mL/min (ref >60)
Glucose, Bld: 104 mg/dL — ABNORMAL HIGH (ref 70–99)
Potassium: 3.7 mmol/L (ref 3.5–5.1)
Sodium: 139 mmol/L (ref 135–145)

## 2022-07-13 LAB — MAGNESIUM: Magnesium: 2.2 mg/dL (ref 1.7–2.4)

## 2022-07-13 MED ORDER — GUAIFENESIN-DM 100-10 MG/5ML PO SYRP: 5.0000 mL | ORAL_SOLUTION | ORAL | 0 refills | Status: DC | PRN

## 2022-07-13 MED ORDER — CEPHALEXIN 500 MG PO CAPS: 500.0000 mg | ORAL_CAPSULE | Freq: Four times a day (QID) | ORAL | 0 refills | Status: AC

## 2022-07-13 MED ORDER — PSEUDOEPHEDRINE HCL 60 MG PO TABS: 60.0000 mg | ORAL_TABLET | Freq: Two times a day (BID) | ORAL | 0 refills | Status: AC

## 2022-07-13 MED ORDER — PREDNISONE 10 MG PO TABS: ORAL_TABLET | ORAL | 0 refills | Status: DC

## 2022-07-13 NOTE — Telephone Encounter (Signed)
Transition Care Management Follow-up Telephone Call Date of discharge and from where: 07/13/22 from [facility] for bronchitis How have you been since you were released from the hospital? Pt sounds SOB during call. Pt states he's been helping his wife today with grandchildren & feels tired right now. Any questions or concerns? No  Items Reviewed: Did the pt receive and understand the discharge instructions provided? Yes  Medications obtained and verified? No  pt has not picked up medications yet, but was reviewed during call. Other? No  Any new allergies since your discharge? No  Dietary orders reviewed? No Do you have support at home? Yes   Home Care and Equipment/Supplies: Were home health services ordered? not applicable  Were any new equipment or medical supplies ordered?  No   Functional Questionnaire: (I = Independent and D = Dependent) ADLs: I  Bathing/Dressing- I  Meal Prep- I  Eating- I  Maintaining continence- I  Transferring/Ambulation- I  Managing Meds- I  Follow up appointments reviewed:  PCP Hospital f/u appt confirmed? Yes  Scheduled to see Dr Sarajane Jews on 07/20/22 @ 1015. [facility] f/u appt confirmed? Yes  Scheduled to see Dr Erin Fulling on 08/03/22 @ 1515. Are transportation arrangements needed? No  If their condition worsens, is the pt aware to call PCP or go to the Emergency Dept.? Yes Was the patient provided with contact information for the PCP's office or ED? Yes Was to pt encouraged to call back with questions or concerns? Yes

## 2022-07-13 NOTE — Progress Notes (Signed)
Verbal and written AVS discharge paperwork reviewed with patient. Patient verbalized understanding. Rodriguez aware to go to pharmacy to pick up prescriptions, all of belonging bagged with all belongings. Wheelchair to lobby where wife will pick up to go home. Patient educated to use acapella and incentive spirometer and increase activity as tolerated pt took IS and acpella home.

## 2022-07-13 NOTE — Discharge Summary (Signed)
Physician Discharge Summary  Gillermo Poch RUE:[id number] DOB: April 15, 1956 DOA: 07/08/2022  PCP: Laurey Morale, MD  Admit date: 07/08/2022 Discharge date: 07/13/2022 Recommendations for Outpatient Follow-up:  Follow up with PCP. pulmn in 1 weeks-call for appointment Please obtain BMP/CBC in one week  Discharge Dispo: home Discharge Condition: Stable Code Status:   Code Status: Full Code Diet recommendation:  Diet Order             Diet regular Room service appropriate? Yes; Fluid consistency: Thin  Diet effective now                    Brief/Interim Summary: 66 yo male with PMH HTN, HLD, BPH, asthma, GERD who presented with worsening shortness of breath, cough, chest pain.  He was found to be hypoxic in the 80s requiring 5 L initially.  He was recently treated beginning of September for COVID with Paxlovid.  He was also treated with a Z-Pak after no major improvement.He has progressively had worsened nonproductive cough and shortness of breath.  He therefore presented to the ER for further evaluation. CXR was relatively unremarkable.  This was followed with a CTA chest which was negative for PE.  Did show bronchitis with mucous plugging and near complete occlusion of the left lower lobe.  2.2 cm groundglass opacity noted in the left apex with concern for infectious versus inflammatory vs malignant etiology.  Repeat CT was recommended in approximately 6 months. He was started on Rocephin and azithromycin and admitted for further work-up. Patient was treated for acute respiratory failure RVP positive for enterovirus initially on oxygen D1 on admission seen by pulmonary overall improving.  Labs with improved leukocytosis, now on room air.  At this time wheezing is significantly clinically improved, doing well ambulating well on RA and will be discharged home   Discharge Diagnoses:  Principal Problem:   CAP (community acquired pneumonia) Active Problems:   Acute respiratory failure  with hypoxia ([place])   Rhinovirus infection   Severe sepsis ([place])   Dyslipidemia   Essential hypertension  Severe sepsis due to pneumonia and rhinovirus infection Acute respiratory failure with hypoxia Community-acquired pneumonia Rhinovirus infection: Prolonged course with ongoing wheezing, initially on high oxygen need.  Seen by pulmonary. Has had extensive work-up with CTA chest-shows bronchitis with mucous plugging and near complete occlusion of left lower lobe.  Patient was treated with IV antibiotics, steroids.  Procalcitonin negative, sputum culture not drawn urinary antigen negative RVP positive for rhinovirus.  Follow up with pulmonary as OP - he is weaned off to room air,and wheezing improved and stable for discharge home with outpatient pulmonary and PCP follow-up   Essential hypertension: Controlled on lisinopril-HCTZ Dyslipidemia continue Zocor.   Class I Obesity:Patient's Body mass index is 31.6 kg/m. : Will benefit with PCP follow-up, weight loss  healthy lifestyle and outpatient sleep evaluation.  Consults: Pulmonary Subjective: Alert awake resting comfortably no more wheezing or shortness of breath ambulating well.  Saturating well on room air.  Discharge Exam: Vitals:   07/13/22 1055 07/13/22 1100  BP:    Pulse:    Resp:    Temp:    SpO2: 94% 93%   General: Pt is alert, awake, not in acute distress Cardiovascular: RRR, S1/S2 +, no rubs, no gallops Respiratory: CTA bilaterally, no wheezing, no rhonchi Abdominal: Soft, NT, ND, bowel sounds + Extremities: no edema, no cyanosis  Discharge Instructions  Discharge Instructions     Discharge instructions   Complete by: As directed  Follow-up with PCP and pulmonary as advised.  Please call call MD or return to ER for similar or worsening recurring problem that brought you to hospital or if any fever,nausea/vomiting,abdominal pain, uncontrolled pain, chest pain,  shortness of breath or any other alarming  symptoms.  Please follow-up your doctor as instructed in a week time and call the office for appointment.  Please avoid alcohol, smoking, or any other illicit substance and maintain healthy habits including taking your regular medications as prescribed.  You were cared for by a hospitalist during your hospital stay. If you have any questions about your discharge medications or the care you received while you were in the hospital after you are discharged, you can call the unit and ask to speak with the hospitalist on call if the hospitalist that took care of you is not available.  Once you are discharged, your primary care physician will handle any further medical issues. Please note that NO REFILLS for any discharge medications will be authorized once you are discharged, as it is imperative that you return to your primary care physician (or establish a relationship with a primary care physician if you do not have one) for your aftercare needs so that they can reassess your need for medications and monitor your lab values   Increase activity slowly   Complete by: As directed       Allergies as of 07/13/2022   No Known Allergies      Medication List     TAKE these medications    albuterol 108 (90 Base) MCG/ACT inhaler Commonly known as: VENTOLIN HFA Inhale 2 puffs into the lungs every 4 (four) hours as needed for wheezing or shortness of breath.   albuterol (2.5 MG/3ML) 0.083% nebulizer solution Commonly known as: PROVENTIL Take 3 mLs (2.5 mg total) by nebulization every 4 (four) hours as needed for wheezing or shortness of breath.   cephALEXin 500 MG capsule Commonly known as: KEFLEX Take 1 capsule (500 mg total) by mouth 4 (four) times daily for 3 days.   Dulera 50-5 MCG/ACT Aero Generic drug: Mometasone Furo-Formoterol Fum Take 2 Inhalations by mouth in the morning and at bedtime.   guaiFENesin-dextromethorphan 100-10 MG/5ML syrup Commonly known as: ROBITUSSIN DM Take 5 mLs  by mouth every 4 (four) hours as needed for cough.   HYDROcodone bit-homatropine 5-1.5 MG/5ML syrup Commonly known as: HYCODAN Take 5 mLs by mouth every 4 (four) hours as needed for cough.   lisinopril-hydrochlorothiazide 10-12.5 MG tablet Commonly known as: ZESTORETIC TAKE 1 TABLET BY MOUTH DAILY (MAX ON INS)   montelukast 10 MG tablet Commonly known as: SINGULAIR TAKE 1 TABLET AT BEDTIME BY MOUTH   pantoprazole 40 MG tablet Commonly known as: PROTONIX Take 1 tablet (40 mg total) by mouth daily.   predniSONE 10 MG tablet Commonly known as: DELTASONE Take 3 tabs daily x 3 days,2 tabs daily x 4 days,1 tab daily x 4 days then half tab for 4 days stop. Start taking on: July 21, 2022   pseudoephedrine 60 MG tablet Commonly known as: SUDAFED Take 1 tablet (60 mg total) by mouth 2 (two) times daily with a meal for 3 days.   simvastatin 20 MG tablet Commonly known as: ZOCOR Take 1 tablet (20 mg total) by mouth daily.        Follow-up Information     Laurey Morale, MD Follow up in 1 week(s).   Specialty: Family Medicine Contact information: [address] [place] [address] [phone]  Freddi Starr, MD. Schedule an appointment as soon as possible for a visit.   Specialty: Pulmonary Disease Contact information: [address] [place] [address] [place] [address] [phone]                No Known Allergies  The results of significant diagnostics from this hospitalization (including imaging, microbiology, ancillary and laboratory) are listed below for reference.    Microbiology: Recent Results (from the past 240 hour(s))  Culture, blood (routine x 2) Call MD if unable to obtain prior to antibiotics being given     Status: None (Preliminary result)   Collection Time: 07/09/22  3:23 AM   Specimen: BLOOD LEFT HAND  Result Value Ref Range Status   Specimen Description   Final    BLOOD LEFT HAND Performed at [facility], [place] [address], [place], [place] [address]    Special Requests   Final    BOTTLES DRAWN AEROBIC AND ANAEROBIC Blood Culture adequate volume Performed at [place] [address] [place], [place] [address]    Culture   Final    NO GROWTH 4 DAYS Performed at [facility], [place] [address], [place], [place] [address]    Report Status PENDING  Incomplete  Culture, blood (routine x 2) Call MD if unable to obtain prior to antibiotics being given     Status: None (Preliminary result)   Collection Time: 07/09/22 12:21 PM   Specimen: BLOOD  Result Value Ref Range Status   Specimen Description   Final    BLOOD LEFT ANTECUBITAL Performed at [place] [address] [place], [place] [address]    Special Requests   Final    BOTTLES DRAWN AEROBIC ONLY Blood Culture adequate volume Performed at [place] [address] [place], [place] [address]    Culture   Final    NO GROWTH 4 DAYS Performed at [facility], [place] [address], [place], [place] [address]    Report Status PENDING  Incomplete  MRSA Next Gen by PCR, Nasal     Status: None   Collection Time: 07/09/22 12:45 PM   Specimen: Nasal Mucosa; Nasal Swab  Result Value Ref Range Status   MRSA by PCR Next Gen NOT DETECTED NOT DETECTED Final    Comment: (NOTE) The GeneXpert MRSA Assay (FDA approved for NASAL specimens only), is one component of a comprehensive MRSA colonization surveillance program. It is not intended to diagnose MRSA infection nor to guide or monitor treatment for MRSA infections. Test performance is not FDA approved in patients less than 34 years old. Performed at [facility], [place] [address] [place], [place] [address]   Respiratory (~20 pathogens) panel by PCR     Status: Abnormal   Collection Time: 07/09/22  1:46 PM   Specimen: Nasopharyngeal Swab; Respiratory  Result Value Ref Range Status    Adenovirus NOT DETECTED NOT DETECTED Final   Coronavirus 229E NOT DETECTED NOT DETECTED Final    Comment: (NOTE) The Coronavirus on the Respiratory Panel, DOES NOT test for the novel  Coronavirus (2019 nCoV)    Coronavirus HKU1 NOT DETECTED NOT DETECTED Final   Coronavirus NL63 NOT DETECTED NOT DETECTED Final   Coronavirus OC43 NOT DETECTED NOT DETECTED Final   Metapneumovirus NOT DETECTED NOT DETECTED Final   Rhinovirus / Enterovirus DETECTED (A) NOT DETECTED Final   Influenza A NOT DETECTED NOT DETECTED Final   Influenza B  NOT DETECTED NOT DETECTED Final   Parainfluenza Virus 1 NOT DETECTED NOT DETECTED Final   Parainfluenza Virus 2 NOT DETECTED NOT DETECTED Final   Parainfluenza Virus 3 NOT DETECTED NOT DETECTED Final   Parainfluenza Virus 4 NOT DETECTED NOT DETECTED Final   Respiratory Syncytial Virus NOT DETECTED NOT DETECTED Final   Bordetella pertussis NOT DETECTED NOT DETECTED Final   Bordetella Parapertussis NOT DETECTED NOT DETECTED Final   Chlamydophila pneumoniae NOT DETECTED NOT DETECTED Final   Mycoplasma pneumoniae NOT DETECTED NOT DETECTED Final    Comment: Performed at [facility], [place] [address], [place], [place] [address]  Expectorated Sputum Assessment w Gram Stain, Rflx to Resp Cult     Status: None   Collection Time: 07/10/22  9:49 AM   Specimen: Sputum  Result Value Ref Range Status   Specimen Description SPUTUM  Final   Special Requests SPUTUM  Final   Sputum evaluation   Final    Sputum specimen not acceptable for testing.  Please recollect.   CALLED TO CINDY @ 1040 07/10/22. GILBERT, L Performed at [facility], [place] [address], [place], [place] [address]    Report Status 07/10/2022 FINAL  Final    Procedures/Studies: CT Angio Chest PE W and/or Wo Contrast  Result Date: 07/08/2022 CLINICAL DATA:  Increasing shortness of breath; PE suspected EXAM: CT ANGIOGRAPHY CHEST WITH CONTRAST TECHNIQUE: Multidetector CT imaging of the chest  was performed using the standard protocol during bolus administration of intravenous contrast. Multiplanar CT image reconstructions and MIPs were obtained to evaluate the vascular anatomy. RADIATION DOSE REDUCTION: This exam was performed according to the departmental dose-optimization program which includes automated exposure control, adjustment of the mA and/or kV according to patient size and/or use of iterative reconstruction technique. CONTRAST:  180m OMNIPAQUE IOHEXOL 350 MG/ML SOLN COMPARISON:  Radiographs earlier today FINDINGS: Cardiovascular: Satisfactory opacification of the central pulmonary arteries. The bilateral lower lobe segmental pulmonary arteries are obscured by motion. No evidence of pulmonary embolism. Normal heart size. No pericardial effusion. Mediastinum/Nodes: No enlarged mediastinal, hilar, or axillary lymph nodes. Thyroid gland, trachea, and esophagus demonstrate no significant findings. Lungs/Pleura: Marked bronchial wall thickening and mucous plugging in the bilateral lower lobes and lingula. Frothy mucous near completely occludes the left lower lobe bronchus. Geographic ground-glass opacity in the left apex measures 2.1 x 2.3 cm (series 10/image 16). Additional small solid pulmonary nodule in the lingula measures 3 mm (series 10/image 100). No pleural effusion or pneumothorax. Upper Abdomen: Hepatic steatosis.  No acute abnormality. Musculoskeletal: No chest wall abnormality. No acute osseous findings. Review of the MIP images confirms the above findings. IMPRESSION: 1. No central pulmonary embolism. The segmental arteries in the lung bases are obscured by motion. 2. Bronchitis with mucous plugging and near-complete occlusion of the left lower lobe bronchus secondary to frothy mucous. Aspiration not excluded. 3. 2.2 cm (average dimension) ground-glass opacity in the left apex may be infectious/inflammatory however malignancy is not excluded. Initial follow-up with CT at 6 months is  recommended to confirm persistence. If persistent, repeat CT is recommended every 2 years until 5 years of stability has been established. This recommendation follows the consensus statement: Guidelines for Management of Incidental Pulmonary Nodules Detected on CT Images: From the Fleischner Society 2017; Radiology 2017; 284:228-243. Electronically Signed   By: TPlacido SouM.D.   On: 07/08/2022 22:17   DG Chest 2 View  Result Date: 07/08/2022 CLINICAL DATA:  Shortness of breath, cough EXAM: CHEST - 2  VIEW COMPARISON:  04/05/2020 FINDINGS: The heart size and mediastinal contours are within normal limits. Both lungs are clear. Disc degenerative disease of the thoracic spine. IMPRESSION: No acute abnormality of the lungs. Electronically Signed   By: Delanna Ahmadi M.D.   On: 07/08/2022 16:40    Labs: BNP (last 3 results) Recent Labs    07/08/22 1920  BNP 40.9   Basic Metabolic Panel: Recent Labs  Lab 07/08/22 2252 07/09/22 0736 07/10/22 0304 07/11/22 0316 07/12/22 0609 07/13/22 0540  NA  --  135 137 140 138 139  K  --  3.8 4.6 4.2 4.2 3.7  CL  --  102 102 105 108 103  CO2  --  '22 27 28 24 26  '$ GLUCOSE  --  138* 151* 104* 121* 104*  BUN  --  '14 22 17 '$ 31* 16  CREATININE  --  0.71 1.00 0.82 1.05 0.77  CALCIUM  --  9.2 8.8* 8.6* 8.6* 8.8*  MG 2.1 2.1 2.2 1.9 2.1 2.2  PHOS 3.1 3.8  --   --   --   --    Liver Function Tests: Recent Labs  Lab 07/09/22 0736  AST 23  ALT 30  ALKPHOS 62  BILITOT 0.9  PROT 7.6  ALBUMIN 4.0   No results for input(s): "LIPASE", "AMYLASE" in the last 168 hours. No results for input(s): "AMMONIA" in the last 168 hours. CBC: Recent Labs  Lab 07/09/22 0736 07/10/22 0304 07/11/22 0316 07/12/22 0811 07/13/22 0540  WBC 10.6* 19.0* 13.4* 10.5 9.2  NEUTROABS 9.0* 17.1* 10.1* 7.2 5.7  HGB 15.4 15.1 14.3 15.0 14.4  HCT 45.6 46.4 44.4 45.5 43.7  MCV 88.9 92.2 92.7 91.7 91.2  PLT 368 316 298 325 286   Cardiac Enzymes: Recent Labs  Lab  07/08/22 2252  CKTOTAL 155   BNP: Invalid input(s): "POCBNP" CBG: No results for input(s): "GLUCAP" in the last 168 hours. D-Dimer No results for input(s): "DDIMER" in the last 72 hours. Hgb A1c No results for input(s): "HGBA1C" in the last 72 hours. Lipid Profile No results for input(s): "CHOL", "HDL", "LDLCALC", "TRIG", "CHOLHDL", "LDLDIRECT" in the last 72 hours. Thyroid function studies No results for input(s): "TSH", "T4TOTAL", "T3FREE", "THYROIDAB" in the last 72 hours.  Invalid input(s): "FREET3" Anemia work up No results for input(s): "VITAMINB12", "FOLATE", "FERRITIN", "TIBC", "IRON", "RETICCTPCT" in the last 72 hours. Urinalysis    Component Value Date/Time   COLORURINE yellow 12/23/2009 0841   APPEARANCEUR Clear 12/23/2009 0841   LABSPEC 1.020 12/23/2009 0841   PHURINE 6.0 12/23/2009 0841   HGBUR negative 12/23/2009 0841   BILIRUBINUR - 09/08/2019 0927   PROTEINUR Positive (A) 09/08/2019 0927   UROBILINOGEN 0.2 09/08/2019 0927   UROBILINOGEN 0.2 12/23/2009 0841   NITRITE - 09/08/2019 0927   NITRITE negative 12/23/2009 0841   LEUKOCYTESUR Negative 09/08/2019 0927   Sepsis Labs Recent Labs  Lab 07/10/22 0304 07/11/22 0316 07/12/22 0811 07/13/22 0540  WBC 19.0* 13.4* 10.5 9.2   Microbiology Recent Results (from the past 240 hour(s))  Culture, blood (routine x 2) Call MD if unable to obtain prior to antibiotics being given     Status: None (Preliminary result)   Collection Time: 07/09/22  3:23 AM   Specimen: BLOOD LEFT HAND  Result Value Ref Range Status   Specimen Description   Final    BLOOD LEFT HAND Performed at [facility], [place] [address] [place], [place] [address]    Special Requests   Final  BOTTLES DRAWN AEROBIC AND ANAEROBIC Blood Culture adequate volume Performed at [place] [address], [place], [place] [address]    Culture   Final    NO GROWTH 4 DAYS Performed at [facility],  [place] [address] [place], [place] [address]    Report Status PENDING  Incomplete  Culture, blood (routine x 2) Call MD if unable to obtain prior to antibiotics being given     Status: None (Preliminary result)   Collection Time: 07/09/22 12:21 PM   Specimen: BLOOD  Result Value Ref Range Status   Specimen Description   Final    BLOOD LEFT ANTECUBITAL Performed at [place] [address], [place], [place] [address]    Special Requests   Final    BOTTLES DRAWN AEROBIC ONLY Blood Culture adequate volume Performed at [place] [address], [place], [place] [address]    Culture   Final    NO GROWTH 4 DAYS Performed at [facility], [place] [address], [place], [place] [address]    Report Status PENDING  Incomplete  MRSA Next Gen by PCR, Nasal     Status: None   Collection Time: 07/09/22 12:45 PM   Specimen: Nasal Mucosa; Nasal Swab  Result Value Ref Range Status   MRSA by PCR Next Gen NOT DETECTED NOT DETECTED Final    Comment: (NOTE) The GeneXpert MRSA Assay (FDA approved for NASAL specimens only), is one component of a comprehensive MRSA colonization surveillance program. It is not intended to diagnose MRSA infection nor to guide or monitor treatment for MRSA infections. Test performance is not FDA approved in patients less than 37 years old. Performed at [facility], [place] [address], [place], [place] [address]   Respiratory (~20 pathogens) panel by PCR     Status: Abnormal   Collection Time: 07/09/22  1:46 PM   Specimen: Nasopharyngeal Swab; Respiratory  Result Value Ref Range Status   Adenovirus NOT DETECTED NOT DETECTED Final   Coronavirus 229E NOT DETECTED NOT DETECTED Final    Comment: (NOTE) The Coronavirus on the Respiratory Panel, DOES NOT test for the novel  Coronavirus (2019 nCoV)    Coronavirus HKU1 NOT DETECTED NOT DETECTED Final   Coronavirus NL63 NOT DETECTED NOT DETECTED Final   Coronavirus OC43  NOT DETECTED NOT DETECTED Final   Metapneumovirus NOT DETECTED NOT DETECTED Final   Rhinovirus / Enterovirus DETECTED (A) NOT DETECTED Final   Influenza A NOT DETECTED NOT DETECTED Final   Influenza B NOT DETECTED NOT DETECTED Final   Parainfluenza Virus 1 NOT DETECTED NOT DETECTED Final   Parainfluenza Virus 2 NOT DETECTED NOT DETECTED Final   Parainfluenza Virus 3 NOT DETECTED NOT DETECTED Final   Parainfluenza Virus 4 NOT DETECTED NOT DETECTED Final   Respiratory Syncytial Virus NOT DETECTED NOT DETECTED Final   Bordetella pertussis NOT DETECTED NOT DETECTED Final   Bordetella Parapertussis NOT DETECTED NOT DETECTED Final   Chlamydophila pneumoniae NOT DETECTED NOT DETECTED Final   Mycoplasma pneumoniae NOT DETECTED NOT DETECTED Final    Comment: Performed at [facility], [place]. [address], [place], [place] [address]  Expectorated Sputum Assessment w Gram Stain, Rflx to Resp Cult     Status: None   Collection Time: 07/10/22  9:49 AM   Specimen: Sputum  Result Value Ref Range Status   Specimen Description SPUTUM  Final   Special Requests SPUTUM  Final   Sputum evaluation  Final    Sputum specimen not acceptable for testing.  Please recollect.   CALLED TO CINDY @ 1040 07/10/22. GILBERT, L Performed at [facility], [place] [address], [place], [place] [address]    Report Status 07/10/2022 FINAL  Final     Time coordinating discharge: 25 minutes  SIGNED: Antonieta Pert, MD  [facility] 07/13/2022, 11:24 AM  If 7PM-7AM, please contact night-coverage [email or web address]

## 2022-07-14 LAB — CULTURE, BLOOD (ROUTINE X 2)
Culture: NO GROWTH
Culture: NO GROWTH
Special Requests: ADEQUATE
Special Requests: ADEQUATE

## 2022-07-20 ENCOUNTER — Ambulatory Visit (INDEPENDENT_AMBULATORY_CARE_PROVIDER_SITE_OTHER): Payer: 59 | Admitting: Family Medicine

## 2022-07-20 ENCOUNTER — Encounter: Payer: Self-pay | Admitting: Family Medicine

## 2022-07-20 VITALS — BP 110/64 | HR 103 | Temp 98.1°F | Wt 212.0 lb

## 2022-07-20 DIAGNOSIS — J4521 Mild intermittent asthma with (acute) exacerbation: Secondary | ICD-10-CM | POA: Diagnosis not present

## 2022-07-20 DIAGNOSIS — B348 Other viral infections of unspecified site: Secondary | ICD-10-CM | POA: Diagnosis not present

## 2022-07-20 DIAGNOSIS — J189 Pneumonia, unspecified organism: Secondary | ICD-10-CM

## 2022-07-20 DIAGNOSIS — J9601 Acute respiratory failure with hypoxia: Secondary | ICD-10-CM | POA: Diagnosis not present

## 2022-07-20 MED ORDER — FLUTICASONE PROPIONATE 50 MCG/ACT NA SUSP: 2.0000 | Freq: Every day | NASAL | 6 refills | Status: DC

## 2022-07-20 NOTE — Progress Notes (Signed)
   Subjective:    Patient ID: Marcus Grant, male    DOB: 1956-01-04, 66 y.o.   MRN: [id number]  HPI Here to follow up a hospital stay from 07-08-22 to 07-13-22 for a LLL pneumonia and acute respiratory failure. He presented with SOB, weakness, and a dry cough. This cough had started about 2 weeks prior to that, but the SOB came on suddenly 2 days before admission. No fever or chest pain. He was using his inhaler and his nebulizer at home with no response. He had two CT angiograms which ruled out PE, but they demonstrated a LLL pneumonia and a mucus plug in the left lower bronchial tube. They also saw a 2.2 cm opacity in the left apex which had a ground glass appearance which could indicate possible infection or malignancy. He was treated with nebulizers, oxygen, IV Rocephin and Azithromycin, and steroids. Testing revealed an infection with rhinovirus. His WBC was 19.0 on admission and this went down to 9.2 at DC. His renal function remained normal. He was sent home off of oxygen and using Dulera BID. He was placed on a Prednisone taper which will be finished in 3 days. Now he is fatigued, but his chest feels fine. No SOB or coughing. Appetite is good.    Review of Systems  Constitutional:  Positive for fatigue. Negative for fever.  HENT: Negative.    Eyes: Negative.   Respiratory: Negative.    Cardiovascular: Negative.   Gastrointestinal: Negative.        Objective:   Physical Exam Constitutional:      Appearance: Normal appearance. He is not ill-appearing.  Cardiovascular:     Rate and Rhythm: Normal rate and regular rhythm.     Pulses: Normal pulses.     Heart sounds: Normal heart sounds.  Pulmonary:     Effort: Pulmonary effort is normal.     Breath sounds: Normal breath sounds.  Lymphadenopathy:     Cervical: No cervical adenopathy.  Neurological:     Mental Status: He is alert.           Assessment & Plan:  He is recovering from a CAP presumably due to  rhinovirus  infection. He will continue to use Dulera BID, along with albuterol as needed. He will follow up with Pulmonology on 08-03-22 . We will keep him ou of work for now, with a target return to work day on 08-07-22. We spent a total of ( 35  ) minutes reviewing records and discussing these issues.  Alysia Penna, MD

## 2022-07-27 ENCOUNTER — Ambulatory Visit (AMBULATORY_SURGERY_CENTER): Payer: Self-pay

## 2022-07-27 VITALS — Ht 69.0 in | Wt 211.0 lb

## 2022-07-27 DIAGNOSIS — Z86010 Personal history of colonic polyps: Secondary | ICD-10-CM

## 2022-07-27 MED ORDER — NA SULFATE-K SULFATE-MG SULF 17.5-3.13-1.6 GM/177ML PO SOLN: 1.0000 | Freq: Once | ORAL | 0 refills | Status: AC

## 2022-07-27 NOTE — Progress Notes (Signed)

## 2022-07-31 ENCOUNTER — Encounter: Payer: Self-pay | Admitting: Family Medicine

## 2022-08-02 ENCOUNTER — Encounter: Payer: 59 | Admitting: Gastroenterology

## 2022-08-02 NOTE — Telephone Encounter (Signed)
It is very difficult to assess this with pictures. Set up an in person OV so we can examine him

## 2022-08-03 ENCOUNTER — Encounter: Payer: Self-pay | Admitting: Adult Health

## 2022-08-03 ENCOUNTER — Ambulatory Visit (INDEPENDENT_AMBULATORY_CARE_PROVIDER_SITE_OTHER): Payer: 59 | Admitting: Adult Health

## 2022-08-03 ENCOUNTER — Inpatient Hospital Stay: Payer: 59 | Admitting: Pulmonary Disease

## 2022-08-03 VITALS — BP 128/64 | HR 97 | Temp 97.7°F | Ht 69.0 in | Wt 215.6 lb

## 2022-08-03 DIAGNOSIS — J9601 Acute respiratory failure with hypoxia: Secondary | ICD-10-CM | POA: Diagnosis not present

## 2022-08-03 DIAGNOSIS — R911 Solitary pulmonary nodule: Secondary | ICD-10-CM | POA: Insufficient documentation

## 2022-08-03 DIAGNOSIS — J4531 Mild persistent asthma with (acute) exacerbation: Secondary | ICD-10-CM | POA: Diagnosis not present

## 2022-08-03 DIAGNOSIS — J189 Pneumonia, unspecified organism: Secondary | ICD-10-CM

## 2022-08-03 DIAGNOSIS — A419 Sepsis, unspecified organism: Secondary | ICD-10-CM | POA: Diagnosis not present

## 2022-08-03 DIAGNOSIS — R6520 Severe sepsis without septic shock: Secondary | ICD-10-CM

## 2022-08-03 MED ORDER — ALBUTEROL SULFATE HFA 108 (90 BASE) MCG/ACT IN AERS: 1.0000 | INHALATION_SPRAY | Freq: Four times a day (QID) | RESPIRATORY_TRACT | 2 refills | Status: DC | PRN

## 2022-08-03 NOTE — Patient Instructions (Addendum)
Continue on Dulera 2 puffs Twice daily  , rinse after use .  Albuterol inhaler 2 puffs every 6hr as needed .  Activity as tolerated.  CT chest in 6 weeks  Follow up with Dr. Erin Fulling in 6 weeks with PFT and As needed

## 2022-08-03 NOTE — Assessment & Plan Note (Signed)
Recent hospitalization with viral pneumonia with sepsis.  Patient with positive recent COVID-19 and rhinovirus.  CT chest with groundglass opacity in the left apex.  And diffuse bronchitic changes along with frothy mucus in the airways. Clinically patient is improved after empiric antibiotics.  We will continue on mucociliary clearance regimen.  We will need follow-up CT chest in 6 weeks.  Plan  Patient Instructions  Continue on Dulera 2 puffs Twice daily  , rinse after use .  Albuterol inhaler 2 puffs every 6hr as needed .  Activity as tolerated.  CT chest in 6 weeks  Follow up with Dr. Erin Fulling in 6 weeks with PFT and As needed

## 2022-08-03 NOTE — Assessment & Plan Note (Signed)
Resolved

## 2022-08-03 NOTE — Progress Notes (Signed)
$'@Patient't$  ID: Marcus Grant, male    DOB: [birth year], 65 y.o.   MRN: [id number]  Chief Complaint  Patient presents with   Hospitalization Follow-up    Referring provider: Laurey Morale, MD  HPI: 66 year old male seen for pulmonary consult during hospitalization for acute hypoxic respiratory failure with viral pneumonia, respiratory bronchiolitis and sepsis. Medical history significant for asthma  TEST/EVENTS :  CT chest July 08, 2022 showed negative PE.  Bronchitis with mucous plugging and near complete occlusion of the left lower lobe bronchus secondary to frothy mucus, 2.2 cm groundglass opacity left apex and a 3 mm lingular nodule.  08/03/2022 Follow up: Pneumonia, respiratory failure, asthma, post hospital follow-up Patient returns for a posthospital follow-up.  Patient was seen during recent hospitalization earlier this month for pulmonary/critical care consult.  Patient had recently been on vacation on a cruise and came back home with upper respiratory symptoms.  He tested positive for COVID-19.  He was treated with Paxlovid.  He also was given a Z-Pak for ongoing symptoms.  Patient continued to have productive cough shortness of breath.  Subsequent emergency room visit showed he had acute hypoxic respiratory failure.  Chest x-ray was clear however CT chest showed diffuse bronchitis with mucous plugging and left lower lobe bronchus frothy mucus and a groundglass opacity in the left apex.  He was treated with aggressive IV antibiotics.  Along with steroids and nebulized bronchodilators.  Patient did test positive for rhinovirus.  Patient continued to have ongoing clinical improvement and was weaned off oxygen prior to discharge.  Since discharge patient is feeling better.  He still remains weak but cough has totally resolved.  He has no further fevers.  He is eating well.  No nausea vomiting diarrhea chest pain orthopnea PND or hemoptysis.  Patient does have history of asthma.   He was recently started on Dulera twice daily.  No Known Allergies  Immunization History  Administered Date(s) Administered   Influenza Inj Mdck Quad Pf 07/04/2019   Influenza Nasal 06/28/2017   Influenza Split 07/16/2012, 07/15/2013   Influenza, Seasonal, Injecte, Preservative Fre 06/29/2018   Influenza,inj,Quad PF,6+ Mos 06/29/2018, 06/30/2020, 09/26/2021   Influenza-Unspecified 06/25/2014, 07/05/2015, 06/25/2016   PFIZER(Purple Top)SARS-COV-2 Vaccination 01/10/2020, 02/04/2020, 09/11/2020   Pfizer Covid-19 Vaccine Bivalent Booster 69yr & up 07/19/2021   Tdap 09/16/2018   Zoster Recombinat (Shingrix) 12/07/2017, 02/05/2018    Past Medical History:  Diagnosis Date   Asthma    Colon polyps    Diverticulosis    GERD (gastroesophageal reflux disease)    Hyperlipidemia    Hypertension     Tobacco History: Social History   Tobacco Use  Smoking Status Never  Smokeless Tobacco Never   Counseling given: Not Answered   Outpatient Medications Prior to Visit  Medication Sig Dispense Refill   fluticasone (FLONASE) 50 MCG/ACT nasal spray Place 2 sprays into both nostrils daily. 16 g 6   lisinopril-hydrochlorothiazide (ZESTORETIC) 10-12.5 MG tablet TAKE 1 TABLET BY MOUTH DAILY (MAX ON INS) 90 tablet 3   Mometasone Furo-Formoterol Fum (DULERA) 50-5 MCG/ACT AERO Take 2 Inhalations by mouth in the morning and at bedtime. 13 g 11   montelukast (SINGULAIR) 10 MG tablet TAKE 1 TABLET AT BEDTIME BY MOUTH 90 tablet 3   pantoprazole (PROTONIX) 40 MG tablet Take 1 tablet (40 mg total) by mouth daily. 90 tablet 3   simvastatin (ZOCOR) 20 MG tablet Take 1 tablet (20 mg total) by mouth daily. 90 tablet 3   predniSONE (DELTASONE) 10  MG tablet Take 3 tabs daily x 3 days,2 tabs daily x 4 days,1 tab daily x 4 days then half tab for 4 days stop. 23 tablet 0   No facility-administered medications prior to visit.     Review of Systems:   Constitutional:   No  weight loss, night sweats,  Fevers,  chills,  +fatigue, or  lassitude.  HEENT:   No headaches,  Difficulty swallowing,  Tooth/dental problems, or  Sore throat,                No sneezing, itching, ear ache,  +nasal congestion, post nasal drip,   CV:  No chest pain,  Orthopnea, PND, swelling in lower extremities, anasarca, dizziness, palpitations, syncope.   GI  No heartburn, indigestion, abdominal pain, nausea, vomiting, diarrhea, change in bowel habits, loss of appetite, bloody stools.   Resp: No shortness of breath with exertion or at rest.  No excess mucus, no productive cough,  No non-productive cough,  No coughing up of blood.  No change in color of mucus.  No wheezing.  No chest wall deformity  Skin: no rash or lesions.  GU: no dysuria, change in color of urine, no urgency or frequency.  No flank pain, no hematuria   MS:  No joint pain or swelling.  No decreased range of motion.  No back pain.    Physical Exam  BP 128/64 (BP Location: Left Arm, Patient Position: Sitting, Cuff Size: Normal)   Pulse 97   Temp 97.7 F (36.5 C) (Oral)   Ht '5\' 9"'$  (1.753 m)   Wt 215 lb 9.6 oz (97.8 kg)   SpO2 99%   BMI 31.84 kg/m   GEN: A/Ox3; pleasant , NAD, well nourished    HEENT:  [place]/AT,  EACs-clear, TMs-wnl, NOSE-clear, THROAT-clear, no lesions, no postnasal drip or exudate noted.   NECK:  Supple w/ fair ROM; no JVD; normal carotid impulses w/o bruits; no thyromegaly or nodules palpated; no lymphadenopathy.    RESP  Clear  P & A; w/o, wheezes/ rales/ or rhonchi. no accessory muscle use, no dullness to percussion  CARD:  RRR, no m/r/g, no peripheral edema, pulses intact, no cyanosis or clubbing.  GI:   Soft & nt; nml bowel sounds; no organomegaly or masses detected.   Musco: Warm bil, no deformities or joint swelling noted.   Neuro: alert, no focal deficits noted.    Skin: Warm, no lesions or rashes    Lab Results:     ProBNP No results found for: "PROBNP"  Imaging: CT Angio Chest PE W and/or Wo  Contrast  Result Date: 07/08/2022 CLINICAL DATA:  Increasing shortness of breath; PE suspected EXAM: CT ANGIOGRAPHY CHEST WITH CONTRAST TECHNIQUE: Multidetector CT imaging of the chest was performed using the standard protocol during bolus administration of intravenous contrast. Multiplanar CT image reconstructions and MIPs were obtained to evaluate the vascular anatomy. RADIATION DOSE REDUCTION: This exam was performed according to the departmental dose-optimization program which includes automated exposure control, adjustment of the mA and/or kV according to patient size and/or use of iterative reconstruction technique. CONTRAST:  171m OMNIPAQUE IOHEXOL 350 MG/ML SOLN COMPARISON:  Radiographs earlier today FINDINGS: Cardiovascular: Satisfactory opacification of the central pulmonary arteries. The bilateral lower lobe segmental pulmonary arteries are obscured by motion. No evidence of pulmonary embolism. Normal heart size. No pericardial effusion. Mediastinum/Nodes: No enlarged mediastinal, hilar, or axillary lymph nodes. Thyroid gland, trachea, and esophagus demonstrate no significant findings. Lungs/Pleura: Marked bronchial wall thickening and mucous plugging  in the bilateral lower lobes and lingula. Frothy mucous near completely occludes the left lower lobe bronchus. Geographic ground-glass opacity in the left apex measures 2.1 x 2.3 cm (series 10/image 16). Additional small solid pulmonary nodule in the lingula measures 3 mm (series 10/image 100). No pleural effusion or pneumothorax. Upper Abdomen: Hepatic steatosis.  No acute abnormality. Musculoskeletal: No chest wall abnormality. No acute osseous findings. Review of the MIP images confirms the above findings. IMPRESSION: 1. No central pulmonary embolism. The segmental arteries in the lung bases are obscured by motion. 2. Bronchitis with mucous plugging and near-complete occlusion of the left lower lobe bronchus secondary to frothy mucous. Aspiration not  excluded. 3. 2.2 cm (average dimension) ground-glass opacity in the left apex may be infectious/inflammatory however malignancy is not excluded. Initial follow-up with CT at 6 months is recommended to confirm persistence. If persistent, repeat CT is recommended every 2 years until 5 years of stability has been established. This recommendation follows the consensus statement: Guidelines for Management of Incidental Pulmonary Nodules Detected on CT Images: From the Fleischner Society 2017; Radiology 2017; 284:228-243. Electronically Signed   By: Placido Sou M.D.   On: 07/08/2022 22:17   DG Chest 2 View  Result Date: 07/08/2022 CLINICAL DATA:  Shortness of breath, cough EXAM: CHEST - 2 VIEW COMPARISON:  04/05/2020 FINDINGS: The heart size and mediastinal contours are within normal limits. Both lungs are clear. Disc degenerative disease of the thoracic spine. IMPRESSION: No acute abnormality of the lungs. Electronically Signed   By: Delanna Ahmadi M.D.   On: 07/08/2022 16:40    methylPREDNISolone acetate (DEPO-MEDROL) injection 40 mg     Date Action Dose Route User   Discharged on 07/13/2022   Admitted on 07/08/2022   07/03/2022 1723 Given 40 mg Intramuscular (Right Ventrogluteal) Wyvonne Lenz, CMA      methylPREDNISolone acetate (DEPO-MEDROL) injection 80 mg     Date Action Dose Route User   Discharged on 07/13/2022   Admitted on 07/08/2022   07/03/2022 1725 Given 80 mg Intramuscular (Right Ventrogluteal) Wyvonne Lenz, CMA           No data to display          No results found for: "NITRICOXIDE"      Assessment & Plan:   CAP (community acquired pneumonia) Recent hospitalization with viral pneumonia with sepsis.  Patient with positive recent COVID-19 and rhinovirus.  CT chest with groundglass opacity in the left apex.  And diffuse bronchitic changes along with frothy mucus in the airways. Clinically patient is improved after empiric antibiotics.  We will continue on  mucociliary clearance regimen.  We will need follow-up CT chest in 6 weeks.  Plan  Patient Instructions  Continue on Dulera 2 puffs Twice daily  , rinse after use .  Albuterol inhaler 2 puffs every 6hr as needed .  Activity as tolerated.  CT chest in 6 weeks  Follow up with Dr. Erin Fulling in 6 weeks with PFT and As needed        Asthma Improved control.  Continue on Dulera.  Check PFTs on return  Severe sepsis ([facility]) Resolved after empiric antibiotics.  Lung nodule 3 mm lingua nodule  Never smoker -  CT chest pending in 6 weeks.   Acute respiratory failure with hypoxia ([facility]) Resolved   I spent 42   minutes dedicated to the care of this patient on the date of this encounter to include pre-visit review of records, face-to-face time with the  patient discussing conditions above, post visit ordering of testing, clinical documentation with the electronic health record, making appropriate referrals as documented, and communicating necessary findings to members of the patients care team.    Rexene Edison, NP 08/03/2022

## 2022-08-03 NOTE — Assessment & Plan Note (Signed)
Resolved after empiric antibiotics.

## 2022-08-03 NOTE — Assessment & Plan Note (Signed)
Improved control.  Continue on Dulera.  Check PFTs on return

## 2022-08-03 NOTE — Assessment & Plan Note (Signed)
3 mm lingua nodule  Never smoker -  CT chest pending in 6 weeks.

## 2022-08-04 ENCOUNTER — Encounter: Payer: Self-pay | Admitting: Gastroenterology

## 2022-08-10 ENCOUNTER — Ambulatory Visit (AMBULATORY_SURGERY_CENTER): Payer: 59 | Admitting: Gastroenterology

## 2022-08-10 ENCOUNTER — Encounter: Payer: Self-pay | Admitting: Gastroenterology

## 2022-08-10 VITALS — BP 127/81 | HR 70 | Temp 97.3°F | Resp 18 | Ht 68.25 in | Wt 211.0 lb

## 2022-08-10 DIAGNOSIS — D123 Benign neoplasm of transverse colon: Secondary | ICD-10-CM

## 2022-08-10 DIAGNOSIS — D120 Benign neoplasm of cecum: Secondary | ICD-10-CM

## 2022-08-10 DIAGNOSIS — D128 Benign neoplasm of rectum: Secondary | ICD-10-CM | POA: Diagnosis not present

## 2022-08-10 DIAGNOSIS — Z86010 Personal history of colonic polyps: Secondary | ICD-10-CM

## 2022-08-10 DIAGNOSIS — D122 Benign neoplasm of ascending colon: Secondary | ICD-10-CM

## 2022-08-10 DIAGNOSIS — Z09 Encounter for follow-up examination after completed treatment for conditions other than malignant neoplasm: Secondary | ICD-10-CM | POA: Diagnosis present

## 2022-08-10 DIAGNOSIS — D124 Benign neoplasm of descending colon: Secondary | ICD-10-CM

## 2022-08-10 HISTORY — PX: COLONOSCOPY: SHX174

## 2022-08-10 MED ORDER — SODIUM CHLORIDE 0.9 % IV SOLN: 500.0000 mL | Freq: Once | INTRAVENOUS | Status: DC

## 2022-08-10 NOTE — Patient Instructions (Signed)
Handouts Provided:  Polyps and Diverticulosis  No aspirin, ibuprofen, naproxen, or other non-steroidal anti-inflammatory drugs for 2 weeks after polyp removal.   YOU HAD AN ENDOSCOPIC PROCEDURE TODAY AT [facility]:   Refer to the procedure report that was given to you for any specific questions about what was found during the examination.  If the procedure report does not answer your questions, please call your gastroenterologist to clarify.  If you requested that your care partner not be given the details of your procedure findings, then the procedure report has been included in a sealed envelope for you to review at your convenience later.  YOU SHOULD EXPECT: Some feelings of bloating in the abdomen. Passage of more gas than usual.  Walking can help get rid of the air that was put into your GI tract during the procedure and reduce the bloating. If you had a lower endoscopy (such as a colonoscopy or flexible sigmoidoscopy) you may notice spotting of blood in your stool or on the toilet paper. If you underwent a bowel prep for your procedure, you may not have a normal bowel movement for a few days.  Please Note:  You might notice some irritation and congestion in your nose or some drainage.  This is from the oxygen used during your procedure.  There is no need for concern and it should clear up in a day or so.  SYMPTOMS TO REPORT IMMEDIATELY:  Following lower endoscopy (colonoscopy or flexible sigmoidoscopy):  Excessive amounts of blood in the stool  Significant tenderness or worsening of abdominal pains  Swelling of the abdomen that is new, acute  Fever of 100F or higher   For urgent or emergent issues, a gastroenterologist can be reached at any hour by calling [phone]. Do not use MyChart messaging for urgent concerns.    DIET:  We do recommend a small meal at first, but then you may proceed to your regular diet.  Drink plenty of fluids but you should avoid alcoholic  beverages for 24 hours.  ACTIVITY:  You should plan to take it easy for the rest of today and you should NOT DRIVE or use heavy machinery until tomorrow (because of the sedation medicines used during the test).    FOLLOW UP: Our staff will call the number listed on your records the next business day following your procedure.  We will call around 7:15- 8:00 am to check on you and address any questions or concerns that you may have regarding the information given to you following your procedure. If we do not reach you, we will leave a message.     If any biopsies were taken you will be contacted by phone or by letter within the next 1-3 weeks.  Please call us at [phone] if you have not heard about the biopsies in 3 weeks.    SIGNATURES/CONFIDENTIALITY: You and/or your care partner have signed paperwork which will be entered into your electronic medical record.  These signatures attest to the fact that that the information above on your After Visit Summary has been reviewed and is understood.  Full responsibility of the confidentiality of this discharge information lies with you and/or your care-partner.

## 2022-08-10 NOTE — Op Note (Signed)
[facility] Patient Name: Marcus Grant Procedure Date: 08/10/2022 7:53 AM MRN: [id number] Endoscopist: Ladene Artist , MD, [id number] Age: 67 Referring MD:  Date of Birth: June 25, 1956 Gender: Male Account #: [id number] Procedure:                Colonoscopy Indications:              Surveillance: Personal history of adenomatous                            polyps on last colonoscopy 5 years ago Medicines:                Monitored Anesthesia Care Procedure:                Pre-Anesthesia Assessment:                           - Prior to the procedure, a History and Physical                            was performed, and patient medications and                            allergies were reviewed. The patient's tolerance of                            previous anesthesia was also reviewed. The risks                            and benefits of the procedure and the sedation                            options and risks were discussed with the patient.                            All questions were answered, and informed consent                            was obtained. Prior Anticoagulants: The patient has                            taken no anticoagulant or antiplatelet agents. ASA                            Grade Assessment: II - A patient with mild systemic                            disease. After reviewing the risks and benefits,                            the patient was deemed in satisfactory condition to                            undergo the procedure.  After obtaining informed consent, the colonoscope                            was passed under direct vision. Throughout the                            procedure, the patient's blood pressure, pulse, and                            oxygen saturations were monitored continuously. The                            Olympus CF-HQ190L (Serial# [id number]) Colonoscope was                            introduced through the  anus and advanced to the the                            cecum, identified by appendiceal orifice and                            ileocecal valve. The ileocecal valve, appendiceal                            orifice, and rectum were photographed. The quality                            of the bowel preparation was good. The colonoscopy                            was performed without difficulty. The patient                            tolerated the procedure well. Scope In: 8:01:53 AM Scope Out: 8:16:39 AM Scope Withdrawal Time: 0 hours 13 minutes 13 seconds  Total Procedure Duration: 0 hours 14 minutes 46 seconds  Findings:                 The perianal and digital rectal examinations were                            normal.                           A 4 mm polyp was found in the cecum. The polyp was                            sessile. The polyp was removed with a cold biopsy                            forceps. Resection and retrieval were complete.                           A single small localized angioectasia without  bleeding was found in the cecum.                           Six sessile polyps were found in the rectum (2),                            descending colon (1), transverse colon (2) and                            ascending colon (1). The polyps were 6 to 10 mm in                            size. These polyps were removed with a cold snare.                            Resection and retrieval were complete.                           Multiple medium-mouthed diverticula were found in                            the left colon. There was no evidence of                            diverticular bleeding.                           Internal hemorrhoids were found during                            retroflexion. The hemorrhoids were small and Grade                            I (internal hemorrhoids that do not prolapse).                           The exam was otherwise  without abnormality on                            direct and retroflexion views. Complications:            No immediate complications. Estimated blood loss:                            None. Estimated Blood Loss:     Estimated blood loss: none. Impression:               - One 4 mm polyp in the cecum, removed with a cold                            biopsy forceps. Resected and retrieved.                           - Small cecal AVM.                           -  Six 6 to 10 mm polyps in the rectum, in the                            descending colon, in the transverse colon and in                            the ascending colon, removed with a cold snare.                            Resected and retrieved.                           - Mild diverticulosis in the left colon. There was                            no evidence of diverticular bleeding.                           - Internal hemorrhoids.                           - The examination was otherwise normal on direct                            and retroflexion views. Recommendation:           - Repeat colonoscopy, likely 3 years, date to be                            determined after pending pathology results are                            reviewed for surveillance based on pathology                            results.                           - Patient has a contact number available for                            emergencies. The signs and symptoms of potential                            delayed complications were discussed with the                            patient. Return to normal activities tomorrow.                            Written discharge instructions were provided to the                            patient.                           -  Resume previous diet.                           - Continue present medications.                           - Await pathology results.                           - No aspirin, ibuprofen, naproxen, or  other                            non-steroidal anti-inflammatory drugs for 2 weeks                            after polyp removal. Ladene Artist, MD 08/10/2022 8:22:16 AM This report has been signed electronically.

## 2022-08-10 NOTE — Progress Notes (Signed)
Report given to PACU, vss 

## 2022-08-10 NOTE — Progress Notes (Signed)
See 08/03/2022 H&P, no change

## 2022-08-10 NOTE — Progress Notes (Signed)
Pt's states no medical or surgical changes since previsit or office visit. 

## 2022-08-11 ENCOUNTER — Telehealth: Payer: Self-pay

## 2022-08-11 NOTE — Telephone Encounter (Signed)
  Follow up Call-     08/10/2022    7:26 AM  Call back number  Post procedure Call Back phone  # [phone]  Permission to leave phone message Yes     Patient questions:  Do you have a fever, pain , or abdominal swelling? No. Pain Score  0 *  Have you tolerated food without any problems? Yes.    Have you been able to return to your normal activities? Yes.    Do you have any questions about your discharge instructions: Diet   No. Medications  No. Follow up visit  No.  Do you have questions or concerns about your Care? No.  Actions: * If pain score is 4 or above: No action needed, pain <4.

## 2022-09-01 ENCOUNTER — Ambulatory Visit (HOSPITAL_COMMUNITY)
Admission: RE | Admit: 2022-09-01 | Discharge: 2022-09-01 | Disposition: A | Discharge status: Home / Self Care | Payer: Managed Care, Other (non HMO) | Source: Ambulatory Visit | Attending: Adult Health | Admitting: Adult Health

## 2022-09-01 DIAGNOSIS — J189 Pneumonia, unspecified organism: Secondary | ICD-10-CM

## 2022-09-01 DIAGNOSIS — J9601 Acute respiratory failure with hypoxia: Secondary | ICD-10-CM

## 2022-09-03 ENCOUNTER — Encounter: Payer: Self-pay | Admitting: Gastroenterology

## 2022-09-06 ENCOUNTER — Telehealth: Payer: Self-pay | Admitting: Family Medicine

## 2022-09-06 NOTE — Telephone Encounter (Signed)
Please advise 

## 2022-09-06 NOTE — Telephone Encounter (Signed)
Yes he can get the flu shot today

## 2022-09-06 NOTE — Telephone Encounter (Signed)
Pt was hospitalized with Pneumonia and would like to know if MD thought it would be a good idea to get the Flu shot??  Pt is scheduled to get it later this afternoon at [facility].   Please call ASAP.

## 2022-09-07 NOTE — Telephone Encounter (Addendum)
Spoke with patient concerning message.  Cancelled appt at [facility] stated will wait to receive at Flu shot at [facility] on 09/12/22

## 2022-09-11 ENCOUNTER — Ambulatory Visit: Payer: Managed Care, Other (non HMO) | Admitting: Pulmonary Disease

## 2022-09-11 DIAGNOSIS — J189 Pneumonia, unspecified organism: Secondary | ICD-10-CM

## 2022-09-11 DIAGNOSIS — J9601 Acute respiratory failure with hypoxia: Secondary | ICD-10-CM

## 2022-09-11 LAB — PULMONARY FUNCTION TEST
DL/VA % pred: 121 %
DL/VA: 5.09 ml/min/mmHg/L
DLCO cor % pred: 114 %
DLCO cor: 29.44 ml/min/mmHg
DLCO unc % pred: 110 %
DLCO unc: 28.39 ml/min/mmHg
FEF 25-75 Post: 2.47 L/sec
FEF 25-75 Pre: 2.3 L/sec
FEF2575-%Change-Post: 7 %
FEF2575-%Pred-Post: 93 %
FEF2575-%Pred-Pre: 87 %
FEV1-%Change-Post: 2 %
FEV1-%Pred-Post: 88 %
FEV1-%Pred-Pre: 86 %
FEV1-Post: 2.89 L
FEV1-Pre: 2.82 L
FEV1FVC-%Change-Post: 0 %
FEV1FVC-%Pred-Pre: 101 %
FEV6-%Change-Post: 0 %
FEV6-%Pred-Post: 90 %
FEV6-%Pred-Pre: 89 %
FEV6-Post: 3.74 L
FEV6-Pre: 3.71 L
FEV6FVC-%Change-Post: 0 %
FEV6FVC-%Pred-Post: 105 %
FEV6FVC-%Pred-Pre: 105 %
FVC-%Change-Post: 1 %
FVC-%Pred-Post: 86 %
FVC-%Pred-Pre: 84 %
FVC-Post: 3.77 L
FVC-Pre: 3.71 L
Post FEV1/FVC ratio: 77 %
Post FEV6/FVC ratio: 100 %
Pre FEV1/FVC ratio: 76 %
Pre FEV6/FVC Ratio: 100 %
RV % pred: 14 %
RV: 0.33 L
TLC % pred: 62 %
TLC: 4.2 L

## 2022-09-11 NOTE — Patient Instructions (Signed)
Full PFT performed today. °

## 2022-09-11 NOTE — Progress Notes (Signed)
Full PFT performed today. °

## 2022-09-12 ENCOUNTER — Encounter: Payer: Self-pay | Admitting: Family Medicine

## 2022-09-12 ENCOUNTER — Ambulatory Visit (INDEPENDENT_AMBULATORY_CARE_PROVIDER_SITE_OTHER): Payer: 59 | Admitting: Family Medicine

## 2022-09-12 VITALS — BP 110/76 | HR 95 | Temp 98.5°F | Ht 68.25 in | Wt 212.0 lb

## 2022-09-12 DIAGNOSIS — Z0000 Encounter for general adult medical examination without abnormal findings: Secondary | ICD-10-CM

## 2022-09-12 DIAGNOSIS — Z23 Encounter for immunization: Secondary | ICD-10-CM

## 2022-09-12 DIAGNOSIS — K429 Umbilical hernia without obstruction or gangrene: Secondary | ICD-10-CM | POA: Diagnosis not present

## 2022-09-12 LAB — CBC WITH DIFFERENTIAL/PLATELET
Basophils Absolute: 0 10*3/uL (ref 0.0–0.1)
Basophils Relative: 0.6 % (ref 0.0–3.0)
Eosinophils Absolute: 0.1 10*3/uL (ref 0.0–0.7)
Eosinophils Relative: 2 % (ref 0.0–5.0)
HCT: 46 % (ref 39.0–52.0)
Hemoglobin: 15.8 g/dL (ref 13.0–17.0)
Lymphocytes Relative: 26.1 % (ref 12.0–46.0)
Lymphs Abs: 1.6 10*3/uL (ref 0.7–4.0)
MCHC: 34.3 g/dL (ref 30.0–36.0)
MCV: 89.3 fl (ref 78.0–100.0)
Monocytes Absolute: 0.5 10*3/uL (ref 0.1–1.0)
Monocytes Relative: 8.5 % (ref 3.0–12.0)
Neutro Abs: 3.7 10*3/uL (ref 1.4–7.7)
Neutrophils Relative %: 62.8 % (ref 43.0–77.0)
Platelets: 302 10*3/uL (ref 150.0–400.0)
RBC: 5.15 Mil/uL (ref 4.22–5.81)
RDW: 14.5 % (ref 11.5–15.5)
WBC: 5.9 10*3/uL (ref 4.0–10.5)

## 2022-09-12 LAB — LIPID PANEL
Cholesterol: 156 mg/dL (ref 0–200)
HDL: 50.9 mg/dL (ref >39.00)
LDL Cholesterol: 86 mg/dL (ref 0–99)
NonHDL: 104.72
Total CHOL/HDL Ratio: 3
Triglycerides: 96 mg/dL (ref 0.0–149.0)
VLDL: 19.2 mg/dL (ref 0.0–40.0)

## 2022-09-12 LAB — HEPATIC FUNCTION PANEL
ALT: 24 U/L (ref 0–53)
AST: 18 U/L (ref 0–37)
Albumin: 4.5 g/dL (ref 3.5–5.2)
Alkaline Phosphatase: 56 U/L (ref 39–117)
Bilirubin, Direct: 0.1 mg/dL (ref 0.0–0.3)
Total Bilirubin: 0.6 mg/dL (ref 0.2–1.2)
Total Protein: 7 g/dL (ref 6.0–8.3)

## 2022-09-12 LAB — PSA: PSA: 0.94 ng/mL (ref 0.10–4.00)

## 2022-09-12 LAB — BASIC METABOLIC PANEL
BUN: 15 mg/dL (ref 6–23)
CO2: 29 mEq/L (ref 19–32)
Calcium: 9.8 mg/dL (ref 8.4–10.5)
Chloride: 100 mEq/L (ref 96–112)
Creatinine, Ser: 0.9 mg/dL (ref 0.40–1.50)
GFR: 89.99 mL/min (ref >60.00)
Glucose, Bld: 115 mg/dL — ABNORMAL HIGH (ref 70–99)
Potassium: 4.8 mEq/L (ref 3.5–5.1)
Sodium: 139 mEq/L (ref 135–145)

## 2022-09-12 LAB — HEMOGLOBIN A1C: Hgb A1c MFr Bld: 6.4 % (ref 4.6–6.5)

## 2022-09-12 LAB — TSH: TSH: 1.55 u[IU]/mL (ref 0.35–5.50)

## 2022-09-12 MED ORDER — SIMVASTATIN 20 MG PO TABS: 20.0000 mg | ORAL_TABLET | Freq: Every day | ORAL | 3 refills | Status: DC

## 2022-09-12 MED ORDER — PANTOPRAZOLE SODIUM 40 MG PO TBEC: 40.0000 mg | DELAYED_RELEASE_TABLET | Freq: Every day | ORAL | 3 refills | Status: DC

## 2022-09-12 MED ORDER — MONTELUKAST SODIUM 10 MG PO TABS: ORAL_TABLET | ORAL | 3 refills | Status: DC

## 2022-09-12 MED ORDER — LISINOPRIL-HYDROCHLOROTHIAZIDE 10-12.5 MG PO TABS: ORAL_TABLET | ORAL | 3 refills | Status: DC

## 2022-09-12 NOTE — Addendum Note (Signed)
Addended by: Wyvonne Lenz on: 09/12/2022 08:56 AM   Modules accepted: Orders

## 2022-09-12 NOTE — Progress Notes (Signed)
Subjective:    Patient ID: Marcus Grant, male    DOB: [birth year], 66 y.o.   MRN: [id number]  HPI Here for a well exam. He feels fine. He has recovered from pneumonia and sepsis, and he thinks he is back to his baseline. He saw Tammy Parrett in Pulmonology on 10-26 23, and she ordered a chest CT. This showed the left apical ground glass opacities that were seen in the hospital have resolved. He had PFT's yesterday, and he will see Dr. Freda Jackson to discuss the results tomorrow. He has no other complaints.    Review of Systems  Constitutional: Negative.   HENT: Negative.    Eyes: Negative.   Respiratory: Negative.    Cardiovascular: Negative.   Gastrointestinal: Negative.   Genitourinary: Negative.   Musculoskeletal: Negative.   Skin: Negative.   Neurological: Negative.   Psychiatric/Behavioral: Negative.         Objective:   Physical Exam Constitutional:      General: He is not in acute distress.    Appearance: Normal appearance. He is well-developed. He is not diaphoretic.  HENT:     Head: Normocephalic and atraumatic.     Right Ear: External ear normal.     Left Ear: External ear normal.     Nose: Nose normal.     Mouth/Throat:     Pharynx: No oropharyngeal exudate.  Eyes:     General: No scleral icterus.       Right eye: No discharge.        Left eye: No discharge.     Conjunctiva/sclera: Conjunctivae normal.     Pupils: Pupils are equal, round, and reactive to light.  Neck:     Thyroid: No thyromegaly.     Vascular: No JVD.     Trachea: No tracheal deviation.  Cardiovascular:     Rate and Rhythm: Normal rate and regular rhythm.     Heart sounds: Normal heart sounds. No murmur heard.    No friction rub. No gallop.  Pulmonary:     Effort: Pulmonary effort is normal. No respiratory distress.     Breath sounds: Normal breath sounds. No wheezing or rales.  Chest:     Chest wall: No tenderness.  Abdominal:     General: Bowel sounds are normal. There  is no distension.     Palpations: Abdomen is soft. There is no mass.     Tenderness: There is no abdominal tenderness. There is no guarding or rebound.     Comments: He has a non-tender easily reducible umbilical hernia which is stable   Genitourinary:    Penis: Normal. No tenderness.      Testes: Normal.     Prostate: Normal.     Rectum: Normal. Guaiac result negative.  Musculoskeletal:        General: No tenderness. Normal range of motion.     Cervical back: Neck supple.  Lymphadenopathy:     Cervical: No cervical adenopathy.  Skin:    General: Skin is warm and dry.     Coloration: Skin is not pale.     Findings: No erythema or rash.  Neurological:     General: No focal deficit present.     Mental Status: He is alert and oriented to person, place, and time.     Cranial Nerves: No cranial nerve deficit.     Motor: No abnormal muscle tone.     Coordination: Coordination normal.     Deep Tendon Reflexes: Reflexes  are normal and symmetric. Reflexes normal.  Psychiatric:        Mood and Affect: Mood normal.        Behavior: Behavior normal.        Thought Content: Thought content normal.        Judgment: Judgment normal.           Assessment & Plan:  Well exam. We discussed diet and exercise. Get fasting labs. Alysia Penna, MD

## 2022-09-13 ENCOUNTER — Ambulatory Visit: Payer: Managed Care, Other (non HMO) | Admitting: Pulmonary Disease

## 2022-09-13 ENCOUNTER — Encounter: Payer: Self-pay | Admitting: Pulmonary Disease

## 2022-09-13 VITALS — BP 128/78 | HR 96 | Ht 69.0 in | Wt 218.8 lb

## 2022-09-13 DIAGNOSIS — J189 Pneumonia, unspecified organism: Secondary | ICD-10-CM | POA: Diagnosis not present

## 2022-09-13 DIAGNOSIS — J4520 Mild intermittent asthma, uncomplicated: Secondary | ICD-10-CM | POA: Diagnosis not present

## 2022-09-13 NOTE — Progress Notes (Addendum)
$'@Patient'V$  ID: Marcus Grant, male    DOB: 01-04-1956, 66 y.o.   MRN: [id number]  Chief Complaint  Patient presents with   Follow-up    Referring provider: Laurey Morale, MD  HPI: 66 year old male seen for pulmonary consult during hospitalization for acute hypoxic respiratory failure with viral pneumonia, respiratory bronchiolitis and sepsis. Medical history significant for asthma  TEST/EVENTS :  CT chest July 08, 2022 showed negative PE.  Bronchitis with mucous plugging and near complete occlusion of the left lower lobe bronchus secondary to frothy mucus, 2.2 cm groundglass opacity left apex and a 3 mm lingular nodule.  10/12/2022 Follow up: Pneumonia, respiratory failure, asthma, post hospital follow-up Patient returns for a posthospital follow-up.  Patient was seen during recent hospitalization earlier this month for pulmonary/critical care consult.  Patient had recently been on vacation on a cruise and came back home with upper respiratory symptoms.  He tested positive for COVID-19.  He was treated with Paxlovid.  He also was given a Z-Pak for ongoing symptoms.  Patient continued to have productive cough shortness of breath.  Subsequent emergency room visit showed he had acute hypoxic respiratory failure.  Chest x-ray was clear however CT chest showed diffuse bronchitis with mucous plugging and left lower lobe bronchus frothy mucus and a groundglass opacity in the left apex.  He was treated with aggressive IV antibiotics.  Along with steroids and nebulized bronchodilators.  Patient did test positive for rhinovirus.  Patient continued to have ongoing clinical improvement and was weaned off oxygen prior to discharge.  Since discharge patient is feeling better.  He still remains weak but cough has totally resolved.  He has no further fevers.  He is eating well.  No nausea vomiting diarrhea chest pain orthopnea PND or hemoptysis.  Patient does have history of asthma.  He was recently  started on Dulera twice daily.  09/13/22 Follow up: He has done well since his last visit. Repeat CT Chest shows resolution of his pneumonia.    No Known Allergies  Immunization History  Administered Date(s) Administered   Influenza Inj Mdck Quad Pf 07/04/2019   Influenza Nasal 06/28/2017   Influenza Split 07/16/2012, 07/15/2013   Influenza, Seasonal, Injecte, Preservative Fre 06/29/2018   Influenza,inj,Quad PF,6+ Mos 06/29/2018, 06/30/2020, 09/26/2021, 09/12/2022   Influenza-Unspecified 06/25/2014, 07/05/2015, 06/25/2016   PFIZER(Purple Top)SARS-COV-2 Vaccination 01/10/2020, 02/04/2020, 09/11/2020   Pfizer Covid-19 Vaccine Bivalent Booster 55yr & up 07/19/2021   Tdap 09/16/2018   Zoster Recombinat (Shingrix) 12/07/2017, 02/05/2018    Past Medical History:  Diagnosis Date   Asthma    Colon polyps    Diverticulosis    GERD (gastroesophageal reflux disease)    Hyperlipidemia    Hypertension     Tobacco History: Social History   Tobacco Use  Smoking Status Never  Smokeless Tobacco Never   Counseling given: Not Answered   Outpatient Medications Prior to Visit  Medication Sig Dispense Refill   fluticasone (FLONASE) 50 MCG/ACT nasal spray Place 2 sprays into both nostrils daily. 16 g 6   lisinopril-hydrochlorothiazide (ZESTORETIC) 10-12.5 MG tablet TAKE 1 TABLET BY MOUTH DAILY (MAX ON INS) 90 tablet 3   montelukast (SINGULAIR) 10 MG tablet TAKE 1 TABLET AT BEDTIME BY MOUTH 90 tablet 3   pantoprazole (PROTONIX) 40 MG tablet Take 1 tablet (40 mg total) by mouth daily. 90 tablet 3   simvastatin (ZOCOR) 20 MG tablet Take 1 tablet (20 mg total) by mouth daily. 90 tablet 3   Mometasone Furo-Formoterol Fum (DULERA)  50-5 MCG/ACT AERO Take 2 Inhalations by mouth in the morning and at bedtime. 13 g 11   albuterol (VENTOLIN HFA) 108 (90 Base) MCG/ACT inhaler Inhale 1-2 puffs into the lungs every 6 (six) hours as needed. 8 g 2   No facility-administered medications prior to visit.      Review of Systems:  Review of Systems  Constitutional:  Negative for chills, fever, malaise/fatigue and weight loss.  HENT:  Negative for congestion, sinus pain and sore throat.   Eyes: Negative.   Respiratory:  Negative for cough, hemoptysis, sputum production, shortness of breath and wheezing.   Cardiovascular:  Negative for chest pain, palpitations, orthopnea, claudication and leg swelling.  Gastrointestinal:  Negative for abdominal pain, heartburn, nausea and vomiting.  Genitourinary: Negative.   Musculoskeletal:  Negative for joint pain and myalgias.  Skin:  Negative for rash.  Neurological:  Negative for weakness.  Endo/Heme/Allergies: Negative.   Psychiatric/Behavioral: Negative.         Physical Exam  BP 128/78 (BP Location: Left Arm, Cuff Size: Normal)   Pulse 96   Ht '5\' 9"'$  (1.753 m)   Wt 218 lb 12.8 oz (99.2 kg)   SpO2 98%   BMI 32.31 kg/m   Physical Exam Constitutional:      Appearance: Normal appearance. He is obese. He is not ill-appearing.  Eyes:     Conjunctiva/sclera: Conjunctivae normal.  Cardiovascular:     Rate and Rhythm: Normal rate.  Pulmonary:     Effort: Pulmonary effort is normal.     Breath sounds: Normal breath sounds.  Musculoskeletal:     Right lower leg: No edema.     Left lower leg: No edema.  Skin:    General: Skin is warm and dry.  Neurological:     General: No focal deficit present.     Mental Status: He is alert.  Psychiatric:        Mood and Affect: Mood normal.      Lab Results:  ProBNP No results found for: "PROBNP"  Imaging: No results found.        Latest Ref Rng & Units 09/11/2022    3:50 PM  PFT Results  FVC-Pre L 3.71  P  FVC-Predicted Pre % 84  P  FVC-Post L 3.77  P  FVC-Predicted Post % 86  P  Pre FEV1/FVC % % 76  P  Post FEV1/FCV % % 77  P  FEV1-Pre L 2.82  P  FEV1-Predicted Pre % 86  P  FEV1-Post L 2.89  P  DLCO uncorrected ml/min/mmHg 28.39  P  DLCO UNC% % 110  P  DLCO corrected  ml/min/mmHg 29.44  P  DLCO COR %Predicted % 114  P  DLVA Predicted % 121  P  TLC L 4.20  P  TLC % Predicted % 62  P  RV % Predicted % 14  P    P Preliminary result    No results found for: "NITRICOXIDE"      Assessment & Plan:   Community acquired pneumonia of left lower lobe of lung  Mild intermittent reactive airway disease without complication    He has recovered well from his recent hospitalization due to rhinovirus infection and possible secondary bacterial pneumonia. Chest imaging shows resolution of pneumonia. He is to continue dulera inhaler twice daily and then transition off in the new year. He can continue to use albuterol inhaler as needed.   Follow up in 6 months.   Freda Jackson, MD [facility] Office: [phone]   See Amion for personal pager PCCM on call pager [phone] until 7pm. Please call Elink 7p-7a. [phone]

## 2022-09-13 NOTE — Patient Instructions (Addendum)
Continue to use dulera twice daily, you can transition to as needed in the new year and monitor your breathing symptoms  Use albuterol inhaler as needed  Your CT Chest scan has showed resolution of the pneumonia  Recommend getting covid and RSV vaccines  Follow up in 6 months

## 2022-09-26 ENCOUNTER — Ambulatory Visit (INDEPENDENT_AMBULATORY_CARE_PROVIDER_SITE_OTHER): Payer: Managed Care, Other (non HMO) | Admitting: Family Medicine

## 2022-09-26 ENCOUNTER — Encounter: Payer: Self-pay | Admitting: Family Medicine

## 2022-09-26 VITALS — BP 110/70 | HR 104 | Temp 98.8°F | Wt 218.0 lb

## 2022-09-26 DIAGNOSIS — J029 Acute pharyngitis, unspecified: Secondary | ICD-10-CM

## 2022-09-26 DIAGNOSIS — J019 Acute sinusitis, unspecified: Secondary | ICD-10-CM

## 2022-09-26 DIAGNOSIS — R0981 Nasal congestion: Secondary | ICD-10-CM

## 2022-09-26 LAB — POC COVID19 BINAXNOW: SARS Coronavirus 2 Ag: NEGATIVE

## 2022-09-26 LAB — POCT RAPID STREP A (OFFICE): Rapid Strep A Screen: NEGATIVE

## 2022-09-26 MED ORDER — AMOXICILLIN-POT CLAVULANATE 875-125 MG PO TABS: 1.0000 | ORAL_TABLET | Freq: Two times a day (BID) | ORAL | 0 refills | Status: DC

## 2022-09-26 NOTE — Progress Notes (Signed)
   Subjective:    Patient ID: Marcus Grant, male    DOB: 17-Jun-1956, 66 y.o.   MRN: [id number]  HPI Here for 3 days of sinus pressure, ear pressure, PND, ST, and a dry cough. No fever.    Review of Systems  Constitutional: Negative.   HENT:  Positive for congestion, postnasal drip, sinus pressure and sore throat. Negative for ear pain and facial swelling.   Eyes: Negative.   Respiratory:  Positive for cough. Negative for shortness of breath and wheezing.        Objective:   Physical Exam Constitutional:      Appearance: Normal appearance.  HENT:     Right Ear: Tympanic membrane, ear canal and external ear normal.     Left Ear: Tympanic membrane, ear canal and external ear normal.     Nose: Nose normal.     Mouth/Throat:     Pharynx: Oropharynx is clear.  Eyes:     Conjunctiva/sclera: Conjunctivae normal.  Pulmonary:     Effort: Pulmonary effort is normal.     Breath sounds: Normal breath sounds.  Lymphadenopathy:     Cervical: No cervical adenopathy.  Neurological:     Mental Status: He is alert.           Assessment & Plan:  Sinusitis, treat with 10 days of Augmentin. Add Mucinex D as needed. Alysia Penna, MD

## 2022-09-29 ENCOUNTER — Other Ambulatory Visit: Payer: Self-pay

## 2022-09-29 ENCOUNTER — Telehealth: Payer: Self-pay | Admitting: Family Medicine

## 2022-09-29 MED ORDER — LEVOFLOXACIN 500 MG PO TABS: 500.0000 mg | ORAL_TABLET | Freq: Every day | ORAL | 0 refills | Status: DC

## 2022-09-29 NOTE — Telephone Encounter (Signed)
Call in Levaquin 500 mg daily for 10 days  

## 2022-09-29 NOTE — Telephone Encounter (Signed)
Spoke with pt advised that Rx for Levaquin was sent to his pharmacy, verbalized understanding

## 2022-09-29 NOTE — Telephone Encounter (Signed)
Pt was just seen by MD on 09/26/22. Pt states medication prescribed is not working and his cough is very strong and keeping him up at night.  Pt called to ask MD if he could send an Rx for Levofloxacin 500 mg? Pt states when his wife had the same symptoms, she was given Levofloxacin and it opened up her bronchial tubes and she felt a million times better.  Pt stated he needs to get this as soon as possible, as he does not want to be sick all weekend.  Please advise.  [id number] [place], [place] - [address] Phone: [phone] Fax: [phone]                                                    .0                                                 [id number] [place], [place] - [address] Phone: [phone]: [phone]

## 2022-10-03 ENCOUNTER — Other Ambulatory Visit: Payer: Self-pay | Admitting: Family Medicine

## 2022-10-06 ENCOUNTER — Telehealth: Payer: Self-pay | Admitting: Pulmonary Disease

## 2022-10-06 MED ORDER — PREDNISONE 10 MG PO TABS: ORAL_TABLET | ORAL | 0 refills | Status: DC

## 2022-10-06 NOTE — Telephone Encounter (Signed)
I don't think he needs to be tested, would just assume he has it. Prednisone taper sent to [facility] [place]. Can double dose of his dulera for a few days as well. Needs to mask up if he plans to come into appointment on 1/2.

## 2022-10-06 NOTE — Telephone Encounter (Signed)
Spoke with pt who states that he has had increased SOB and wheezing at night for almost a week. Pt states that his wife did test postivite for RSV. Pt states he is negative for Covid and Strep. Pt states that he was given Augmentin by PCP but then switched to Levaquin because Augmentin was not helping. Pt is already scheduled for OV with Dr. Erin Fulling on [place]. 10/10/22. Pt advised that he will have to wear a mask at all times when in the office. Dr. Verlee Monte will you please advise as Dr. Erin Fulling is unavailable.

## 2022-10-06 NOTE — Telephone Encounter (Signed)
Spoke with pt and notified him of Dr. Glenetta Hew advise. Pt stated understanding. Nothing further needed at this time.

## 2022-10-10 ENCOUNTER — Ambulatory Visit (INDEPENDENT_AMBULATORY_CARE_PROVIDER_SITE_OTHER): Payer: Managed Care, Other (non HMO) | Admitting: Pulmonary Disease

## 2022-10-10 ENCOUNTER — Telehealth: Payer: Self-pay | Admitting: Pulmonary Disease

## 2022-10-10 ENCOUNTER — Encounter: Payer: Self-pay | Admitting: Pulmonary Disease

## 2022-10-10 VITALS — BP 128/72 | HR 105 | Ht 69.0 in | Wt 218.0 lb

## 2022-10-10 DIAGNOSIS — B338 Other specified viral diseases: Secondary | ICD-10-CM | POA: Diagnosis not present

## 2022-10-10 DIAGNOSIS — J4531 Mild persistent asthma with (acute) exacerbation: Secondary | ICD-10-CM | POA: Diagnosis not present

## 2022-10-10 MED ORDER — DULERA 100-5 MCG/ACT IN AERO: 2.0000 | INHALATION_SPRAY | Freq: Two times a day (BID) | RESPIRATORY_TRACT | 3 refills | Status: DC

## 2022-10-10 MED ORDER — ALBUTEROL SULFATE (2.5 MG/3ML) 0.083% IN NEBU: 2.5000 mg | INHALATION_SOLUTION | Freq: Four times a day (QID) | RESPIRATORY_TRACT | 12 refills | Status: DC | PRN

## 2022-10-10 NOTE — Telephone Encounter (Signed)
Patient is scheduled for a OV today at 4pm. Per Dr. Erin Fulling, this visit will need to be changed to a video visit.   I attempted to call the patient twice. Call was not answered both times and no VM was setup.   Will attempt to call back later.

## 2022-10-10 NOTE — Progress Notes (Signed)
Synopsis: Referred in December 2024 for hospital follow up  Subjective:   PATIENT ID: Marcus Grant GENDER: male DOB: Oct 09, 1956, MRN: [id number]   HPI  Chief Complaint  Patient presents with   Follow-up    Wife tested positive for RSV on 12/18. Cough came back but went away after starting prednisone on Friday. Denies any fever.    Marcus Grant is a 67 year old male, never smoker with GERD and hypertension who returns to clinic for acute visit.   He was hospitalized 07/09/22 to 07/13/22 for sepsis due to rhinovirus pneumonia. He was last seen 09/13/22 with resolution of his symptoms. He was continued on dulera inhaler. His wife became symptomatic with viral infection and tested positive for RSV. He later developed increased cough, sore throat and shortness of breath. He was started on a steroid taper on 12/29 and has started feeling better as of yesterday. He denies fevers chills or sweats.   Past Medical History:  Diagnosis Date   Asthma    Colon polyps    Diverticulosis    GERD (gastroesophageal reflux disease)    Hyperlipidemia    Hypertension      Family History  Problem Relation Age of Onset   Dementia Mother    Colon cancer Father    Colon polyps Father    Coronary artery disease Other    Colon polyps Other    Esophageal cancer Neg Hx    Stomach cancer Neg Hx    Rectal cancer Neg Hx      Social History   Socioeconomic History   Marital status: Married    Spouse name: Not on file   Number of children: Not on file   Years of education: Not on file   Highest education level: Associate degree: academic program  Occupational History   Not on file  Tobacco Use   Smoking status: Never   Smokeless tobacco: Never  Vaping Use   Vaping Use: Never used  Substance and Sexual Activity   Alcohol use: Yes    Alcohol/week: 2.0 standard drinks of alcohol    Types: 2 Shots of liquor per week    Comment: 2 drinks weekly   Drug use: No   Sexual activity: Not on  file  Other Topics Concern   Not on file  Social History Narrative   Not on file   Social Determinants of Health   Financial Resource Strain: Not on file  Food Insecurity: No Food Insecurity (07/10/2022)   Hunger Vital Sign    Worried About Running Out of Food in the Last Year: Never true    Ran Out of Food in the Last Year: Never true  Transportation Needs: No Transportation Needs (07/10/2022)   PRAPARE - Hydrologist (Medical): No    Lack of Transportation (Non-Medical): No  Physical Activity: Unknown (07/03/2022)   Exercise Vital Sign    Days of Exercise per Week: 3 days    Minutes of Exercise per Session: Not on file  Stress: No Stress Concern Present (07/03/2022)   [place]    Feeling of Stress : Not at all  Social Connections: Unknown (07/03/2022)   Social Connection and Isolation Panel [NHANES]    Frequency of Communication with Friends and Family: Patient refused    Frequency of Social Gatherings with Friends and Family: Patient refused    Attends Religious Services: Patient refused    Active Member of [employer]  or Organizations: Not on file    Attends Club or Organization Meetings: Not on file    Marital Status: Married  Intimate Partner Violence: Not At Risk (07/10/2022)   Humiliation, Afraid, Rape, and Kick questionnaire    Fear of Current or Ex-Partner: No    Emotionally Abused: No    Physically Abused: No    Sexually Abused: No     No Known Allergies   Outpatient Medications Prior to Visit  Medication Sig Dispense Refill   albuterol (VENTOLIN HFA) 108 (90 Base) MCG/ACT inhaler Inhale 1-2 puffs into the lungs every 6 (six) hours as needed. 8 g 2   fluticasone (FLONASE) 50 MCG/ACT nasal spray Place 2 sprays into both nostrils daily. 16 g 6   lisinopril-hydrochlorothiazide (ZESTORETIC) 10-12.5 MG tablet TAKE 1 TABLET BY MOUTH DAILY (MAX ON INS) 90 tablet 3   Mometasone  Furo-Formoterol Fum (DULERA) 50-5 MCG/ACT AERO Take 2 Inhalations by mouth in the morning and at bedtime. 13 g 11   montelukast (SINGULAIR) 10 MG tablet TAKE 1 TABLET AT BEDTIME BY MOUTH 90 tablet 3   pantoprazole (PROTONIX) 40 MG tablet Take 1 tablet (40 mg total) by mouth daily. 90 tablet 3   predniSONE (DELTASONE) 10 MG tablet Take 4 tabs by mouth for 3 days, then 3 for 3 days, 2 for 3 days, 1 for 3 days and stop 30 tablet 0   simvastatin (ZOCOR) 20 MG tablet Take 1 tablet (20 mg total) by mouth daily. 90 tablet 3   amoxicillin-clavulanate (AUGMENTIN) 875-125 MG tablet Take 1 tablet by mouth 2 (two) times daily. 20 tablet 0   levofloxacin (LEVAQUIN) 500 MG tablet Take 1 tablet (500 mg total) by mouth daily. 10 tablet 0   No facility-administered medications prior to visit.    Review of Systems  Constitutional:  Positive for malaise/fatigue. Negative for chills, fever and weight loss.  HENT:  Negative for congestion, sinus pain and sore throat.   Eyes: Negative.   Respiratory:  Positive for cough, shortness of breath and wheezing. Negative for hemoptysis and sputum production.   Cardiovascular:  Negative for chest pain, palpitations, orthopnea, claudication and leg swelling.  Gastrointestinal:  Negative for abdominal pain, heartburn, nausea and vomiting.  Genitourinary: Negative.   Musculoskeletal:  Negative for joint pain and myalgias.  Skin:  Negative for rash.  Neurological:  Negative for weakness.  Endo/Heme/Allergies: Negative.   Psychiatric/Behavioral: Negative.     Objective:   Vitals:   10/10/22 1606  BP: 128/72  Pulse: (!) 105  SpO2: 97%  Weight: 218 lb (98.9 kg)  Height: '5\' 9"'$  (1.753 m)     Physical Exam Constitutional:      General: He is not in acute distress. HENT:     Head: Normocephalic and atraumatic.  Eyes:     Conjunctiva/sclera: Conjunctivae normal.  Cardiovascular:     Rate and Rhythm: Normal rate and regular rhythm.     Pulses: Normal pulses.      Heart sounds: Normal heart sounds. No murmur heard. Pulmonary:     Effort: Pulmonary effort is normal.     Breath sounds: Normal breath sounds. No wheezing or rhonchi.  Musculoskeletal:     Right lower leg: No edema.     Left lower leg: No edema.  Skin:    General: Skin is warm and dry.  Neurological:     General: No focal deficit present.     Mental Status: He is alert.  Psychiatric:  Mood and Affect: Mood normal.        Behavior: Behavior normal.        Thought Content: Thought content normal.        Judgment: Judgment normal.    CBC    Component Value Date/Time   WBC 5.9 09/12/2022 0839   RBC 5.15 09/12/2022 0839   HGB 15.8 09/12/2022 0839   HCT 46.0 09/12/2022 0839   PLT 302.0 09/12/2022 0839   MCV 89.3 09/12/2022 0839   MCH 30.1 07/13/2022 0540   MCHC 34.3 09/12/2022 0839   RDW 14.5 09/12/2022 0839   LYMPHSABS 1.6 09/12/2022 0839   MONOABS 0.5 09/12/2022 0839   EOSABS 0.1 09/12/2022 0839   BASOSABS 0.0 09/12/2022 0839      Latest Ref Rng & Units 09/12/2022    8:39 AM 07/13/2022    5:40 AM 07/12/2022    6:09 AM  BMP  Glucose 70 - 99 mg/dL 115  104  121   BUN 6 - 23 mg/dL '15  16  31   '$ Creatinine 0.40 - 1.50 mg/dL 0.90  0.77  1.05   Sodium 135 - 145 mEq/L 139  139  138   Potassium 3.5 - 5.1 mEq/L 4.8  3.7  4.2   Chloride 96 - 112 mEq/L 100  103  108   CO2 19 - 32 mEq/L '29  26  24   '$ Calcium 8.4 - 10.5 mg/dL 9.8  8.8  8.6     Chest imaging: CT Chest 09/01/22 1. No acute intrathoracic abnormality. 2. Interval resolution of left apical ground-glass airspace opacity. 3. Couple stable left lower lobe pulmonary micronodules. No follow-up needed if patient is low-risk (and has no known or suspected primary neoplasm). Non-contrast chest CT can be considered in 12 months if patient is high-risk. This recommendation follows the consensus statement: Guidelines for Management of Incidental Pulmonary Nodules Detected on CT Images: From the Fleischner Society 2017;  Radiology 2017; 284:228-243. 4. Aortic Atherosclerosis (ICD10-I70.0 including coronary artery) and aortic valve leaflet calcification. Correlate for aortic stenosis.  PFT:    Latest Ref Rng & Units 09/11/2022    3:50 PM  PFT Results  FVC-Pre L 3.71  P  FVC-Predicted Pre % 84  P  FVC-Post L 3.77  P  FVC-Predicted Post % 86  P  Pre FEV1/FVC % % 76  P  Post FEV1/FCV % % 77  P  FEV1-Pre L 2.82  P  FEV1-Predicted Pre % 86  P  FEV1-Post L 2.89  P  DLCO uncorrected ml/min/mmHg 28.39  P  DLCO UNC% % 110  P  DLCO corrected ml/min/mmHg 29.44  P  DLCO COR %Predicted % 114  P  DLVA Predicted % 121  P  TLC L 4.20  P  TLC % Predicted % 62  P  RV % Predicted % 14  P    P Preliminary result    Labs:  Path:  Echo:  Heart Catheterization:  Assessment & Plan:   Mild persistent asthma with acute exacerbation  RSV infection  Discussion: Takashi Korol is a 67 year old male, never smoker with GERD and hypertension who returns to clinic for acute visit.   He likely had acute RSV infection as his wife tested positive. He is to complete his steroid taper as directed. He is to continue dulera 100-30mg 2 puffs twice daily. He can continue albuterol nebulizer as needed.   Follow up in 4-6 weeks if not feeling better. If better, he can follow up  in 5 months as previously scheduled.  Freda Jackson, MD [facility] Office: [phone]   Current Outpatient Medications:    albuterol (VENTOLIN HFA) 108 (90 Base) MCG/ACT inhaler, Inhale 1-2 puffs into the lungs every 6 (six) hours as needed., Disp: 8 g, Rfl: 2   fluticasone (FLONASE) 50 MCG/ACT nasal spray, Place 2 sprays into both nostrils daily., Disp: 16 g, Rfl: 6   lisinopril-hydrochlorothiazide (ZESTORETIC) 10-12.5 MG tablet, TAKE 1 TABLET BY MOUTH DAILY (MAX ON INS), Disp: 90 tablet, Rfl: 3   Mometasone Furo-Formoterol Fum (DULERA) 50-5 MCG/ACT AERO, Take 2 Inhalations by mouth in the morning and at bedtime.,  Disp: 13 g, Rfl: 11   montelukast (SINGULAIR) 10 MG tablet, TAKE 1 TABLET AT BEDTIME BY MOUTH, Disp: 90 tablet, Rfl: 3   pantoprazole (PROTONIX) 40 MG tablet, Take 1 tablet (40 mg total) by mouth daily., Disp: 90 tablet, Rfl: 3   predniSONE (DELTASONE) 10 MG tablet, Take 4 tabs by mouth for 3 days, then 3 for 3 days, 2 for 3 days, 1 for 3 days and stop, Disp: 30 tablet, Rfl: 0   simvastatin (ZOCOR) 20 MG tablet, Take 1 tablet (20 mg total) by mouth daily., Disp: 90 tablet, Rfl: 3

## 2022-10-10 NOTE — Patient Instructions (Addendum)
Use dulera 100-14mg 2 puffs twice daily - rinse mouth out after each use - use with spacer  Continue albuterol nebulizer treatments or inhaler as needed  Continue prednisone taper as prescribed   Follow up in 4-6 weeks if needed based on your symptoms. If all is well, then follow up in 5 months as planned from prior visit.

## 2022-10-12 ENCOUNTER — Encounter: Payer: Self-pay | Admitting: Pulmonary Disease

## 2022-11-01 ENCOUNTER — Telehealth: Payer: Self-pay | Admitting: Pulmonary Disease

## 2022-11-02 NOTE — Telephone Encounter (Signed)
Called and spoke with pt letting him know the info per JD and he verbalized understanding. Nothing further needed. 

## 2022-11-02 NOTE — Telephone Encounter (Signed)
He is safe to get the covid vaccine starting in February.   Thanks, JD

## 2022-11-02 NOTE — Telephone Encounter (Signed)
Called and spoke with pt who wants to know if timing is right now for  him to get the covid vaccine. Pt said he has now been feeling good for 2-3 weeks and would like to now get the vaccine if it would be okay.  Dr. Erin Fulling, please advise.

## 2022-12-12 ENCOUNTER — Telehealth: Payer: Self-pay | Admitting: Pulmonary Disease

## 2022-12-12 NOTE — Telephone Encounter (Signed)
Pt wants to know if Dr. Erin Fulling recommends the RSV shot

## 2022-12-13 NOTE — Telephone Encounter (Signed)
Dr. Erin Fulling please advise See previous encounter

## 2022-12-15 NOTE — Telephone Encounter (Signed)
Yes, I would recommend he get the RSV vaccine within the next 3 months.  Thanks, JD

## 2022-12-15 NOTE — Telephone Encounter (Signed)
Called and left detailed message for patient about Dr Lisabeth Pick recommendations on the RSV vaccine. Nothing further needed

## 2023-02-20 ENCOUNTER — Telehealth: Payer: Self-pay | Admitting: *Deleted

## 2023-02-20 NOTE — Telephone Encounter (Signed)
Received request from patient's pharmacy, [facility] [place], requesting an aerochamber spacer to use with his inhalers.  Please advise.  Thank you.

## 2023-02-21 MED ORDER — AEROCHAMBER MV MISC: 0 refills | Status: DC

## 2023-02-21 NOTE — Telephone Encounter (Signed)
Spacer order has been sent to pt's pharmacy. Nothing further needed.

## 2023-02-21 NOTE — Telephone Encounter (Signed)
Ok to order spacer.  Thanks, JD

## 2023-08-05 ENCOUNTER — Other Ambulatory Visit: Payer: Self-pay | Admitting: Family Medicine

## 2023-08-15 ENCOUNTER — Encounter: Payer: Self-pay | Admitting: Pulmonary Disease

## 2023-08-15 ENCOUNTER — Ambulatory Visit (INDEPENDENT_AMBULATORY_CARE_PROVIDER_SITE_OTHER): Payer: Managed Care, Other (non HMO) | Admitting: Pulmonary Disease

## 2023-08-15 DIAGNOSIS — J4531 Mild persistent asthma with (acute) exacerbation: Secondary | ICD-10-CM | POA: Diagnosis not present

## 2023-08-15 MED ORDER — DULERA 100-5 MCG/ACT IN AERO: 2.0000 | INHALATION_SPRAY | Freq: Two times a day (BID) | RESPIRATORY_TRACT | 11 refills | Status: DC

## 2023-08-15 NOTE — Progress Notes (Signed)
Synopsis: Referred in December 2024 for hospital follow up  Subjective:   PATIENT ID: Marcus Grant GENDER: male DOB: 12-Jul-1956, MRN: [id number]  HPI  Chief Complaint  Patient presents with   Follow-up    Pt is here for F/U visit. Pt need Dulera Rx.    Marcus Grant is a 67 year old male, never smoker with GERD and hypertension who returns to clinic for acute visit.   OV 10/10/22 He was hospitalized 07/09/22 to 07/13/22 for sepsis due to rhinovirus pneumonia. He was last seen 09/13/22 with resolution of his symptoms. He was continued on dulera inhaler. His wife became symptomatic with viral infection and tested positive for RSV. He later developed increased cough, sore throat and shortness of breath. He was started on a steroid taper on 12/29 and has started feeling better as of yesterday. He denies fevers chills or sweats.    Today OV 08/15/23 He reports feeling 'back to my old self' and has returned to work. He has been taking Xolair 'every now and again' as a precaution and Sudafed every other day for nasal congestion. He has been using a generic Flonase nasal spray and requests a refill of Dulera. He reports a recent onset of stuffiness and a scratchy throat, which he attributes to allergies. He has received the COVID-19 and flu vaccines and inquires about the pneumonia vaccine.  Past Medical History:  Diagnosis Date   Asthma    Colon polyps    Diverticulosis    GERD (gastroesophageal reflux disease)    Hyperlipidemia    Hypertension      Family History  Problem Relation Age of Onset   Dementia Mother    Colon cancer Father    Colon polyps Father    Coronary artery disease Other    Colon polyps Other    Esophageal cancer Neg Hx    Stomach cancer Neg Hx    Rectal cancer Neg Hx      Social History   Socioeconomic History   Marital status: Married    Spouse name: Not on file   Number of children: Not on file   Years of education: Not on file   Highest education  level: Associate degree: academic program  Occupational History   Not on file  Tobacco Use   Smoking status: Never   Smokeless tobacco: Never  Vaping Use   Vaping status: Never Used  Substance and Sexual Activity   Alcohol use: Yes    Alcohol/week: 2.0 standard drinks of alcohol    Types: 2 Shots of liquor per week    Comment: 2 drinks weekly   Drug use: No   Sexual activity: Not on file  Other Topics Concern   Not on file  Social History Narrative   Not on file   Social Determinants of Health   Financial Resource Strain: Not on file  Food Insecurity: No Food Insecurity (07/10/2022)   Hunger Vital Sign    Worried About Running Out of Food in the Last Year: Never true    Ran Out of Food in the Last Year: Never true  Transportation Needs: No Transportation Needs (07/10/2022)   PRAPARE - Administrator, Civil Service (Medical): No    Lack of Transportation (Non-Medical): No  Physical Activity: Unknown (07/03/2022)   Exercise Vital Sign    Days of Exercise per Week: 3 days    Minutes of Exercise per Session: Not on file  Stress: No Stress Concern Present (07/03/2022)  [employer] of Occupational Health - Occupational Stress Questionnaire    Feeling of Stress : Not at all  Social Connections: Unknown (07/03/2022)   Social Connection and Isolation Panel [NHANES]    Frequency of Communication with Friends and Family: Patient declined    Frequency of Social Gatherings with Friends and Family: Patient declined    Attends Religious Services: Patient declined    Database administrator or Organizations: Not on file    Attends Banker Meetings: Not on file    Marital Status: Married  Intimate Partner Violence: Not At Risk (07/10/2022)   Humiliation, Afraid, Rape, and Kick questionnaire    Fear of Current or Ex-Partner: No    Emotionally Abused: No    Physically Abused: No    Sexually Abused: No     No Known Allergies   Outpatient Medications Prior  to Visit  Medication Sig Dispense Refill   albuterol (PROVENTIL) (2.5 MG/3ML) 0.083% nebulizer solution Take 3 mLs (2.5 mg total) by nebulization every 6 (six) hours as needed for wheezing or shortness of breath. 75 mL 12   albuterol (VENTOLIN HFA) 108 (90 Base) MCG/ACT inhaler Inhale 1-2 puffs into the lungs every 6 (six) hours as needed. 8 g 2   fluticasone (FLONASE) 50 MCG/ACT nasal spray Place 2 sprays into both nostrils daily. 16 g 6   lisinopril-hydrochlorothiazide (ZESTORETIC) 10-12.5 MG tablet TAKE 1 TABLET BY MOUTH DAILY (MAX ON INS) 90 tablet 3   montelukast (SINGULAIR) 10 MG tablet TAKE 1 TABLET AT BEDTIME BY MOUTH 90 tablet 3   pantoprazole (PROTONIX) 40 MG tablet Take 1 tablet (40 mg total) by mouth daily. 90 tablet 3   simvastatin (ZOCOR) 20 MG tablet Take 1 tablet (20 mg total) by mouth daily. 90 tablet 3   Spacer/Aero-Holding Chambers (AEROCHAMBER MV) inhaler Use as instructed 1 each 0   mometasone-formoterol (DULERA) 100-5 MCG/ACT AERO Inhale 2 puffs into the lungs 2 (two) times daily. 1 each 3   predniSONE (DELTASONE) 10 MG tablet Take 4 tabs by mouth for 3 days, then 3 for 3 days, 2 for 3 days, 1 for 3 days and stop 30 tablet 0   No facility-administered medications prior to visit.    Review of Systems  Constitutional:  Negative for chills, fever, malaise/fatigue and weight loss.  HENT:  Negative for congestion, sinus pain and sore throat.   Eyes: Negative.   Respiratory:  Negative for cough, hemoptysis, sputum production, shortness of breath and wheezing.   Cardiovascular:  Negative for chest pain, palpitations, orthopnea, claudication and leg swelling.  Gastrointestinal:  Negative for abdominal pain, heartburn, nausea and vomiting.  Genitourinary: Negative.   Musculoskeletal:  Negative for joint pain and myalgias.  Skin:  Negative for rash.  Neurological:  Negative for weakness.  Endo/Heme/Allergies: Negative.   Psychiatric/Behavioral: Negative.     Objective:    Vitals:   08/15/23 1534  BP: 110/70  Pulse: 95  SpO2: 96%  Weight: 214 lb 12.8 oz (97.4 kg)  Height: 5' 9.5" (1.765 m)     Physical Exam Constitutional:      General: He is not in acute distress. HENT:     Head: Normocephalic and atraumatic.  Eyes:     Conjunctiva/sclera: Conjunctivae normal.  Cardiovascular:     Rate and Rhythm: Normal rate and regular rhythm.     Pulses: Normal pulses.     Heart sounds: Normal heart sounds. No murmur heard. Pulmonary:     Effort: Pulmonary effort is normal.  Breath sounds: Normal breath sounds. No wheezing or rhonchi.  Musculoskeletal:     Right lower leg: No edema.     Left lower leg: No edema.  Skin:    General: Skin is warm and dry.  Neurological:     General: No focal deficit present.     Mental Status: He is alert.  Psychiatric:        Mood and Affect: Mood normal.        Behavior: Behavior normal.        Thought Content: Thought content normal.        Judgment: Judgment normal.    CBC    Component Value Date/Time   WBC 5.9 09/12/2022 0839   RBC 5.15 09/12/2022 0839   HGB 15.8 09/12/2022 0839   HCT 46.0 09/12/2022 0839   PLT 302.0 09/12/2022 0839   MCV 89.3 09/12/2022 0839   MCH 30.1 07/13/2022 0540   MCHC 34.3 09/12/2022 0839   RDW 14.5 09/12/2022 0839   LYMPHSABS 1.6 09/12/2022 0839   MONOABS 0.5 09/12/2022 0839   EOSABS 0.1 09/12/2022 0839   BASOSABS 0.0 09/12/2022 0839      Latest Ref Rng & Units 09/12/2022    8:39 AM 07/13/2022    5:40 AM 07/12/2022    6:09 AM  BMP  Glucose 70 - 99 mg/dL 191  478  295   BUN 6 - 23 mg/dL 15  16  31    Creatinine 0.40 - 1.50 mg/dL 6.21  3.08  6.57   Sodium 135 - 145 mEq/L 139  139  138   Potassium 3.5 - 5.1 mEq/L 4.8  3.7  4.2   Chloride 96 - 112 mEq/L 100  103  108   CO2 19 - 32 mEq/L 29  26  24    Calcium 8.4 - 10.5 mg/dL 9.8  8.8  8.6     Chest imaging: CT Chest 09/01/22 1. No acute intrathoracic abnormality. 2. Interval resolution of left apical ground-glass  airspace opacity. 3. Couple stable left lower lobe pulmonary micronodules. No follow-up needed if patient is low-risk (and has no known or suspected primary neoplasm). Non-contrast chest CT can be considered in 12 months if patient is high-risk. This recommendation follows the consensus statement: Guidelines for Management of Incidental Pulmonary Nodules Detected on CT Images: From the Fleischner Society 2017; Radiology 2017; 284:228-243. 4. Aortic Atherosclerosis (ICD10-I70.0 including coronary artery) and aortic valve leaflet calcification. Correlate for aortic stenosis.  PFT:    Latest Ref Rng & Units 09/11/2022    3:50 PM  PFT Results  FVC-Pre L 3.71   FVC-Predicted Pre % 84   FVC-Post L 3.77   FVC-Predicted Post % 86   Pre FEV1/FVC % % 76   Post FEV1/FCV % % 77   FEV1-Pre L 2.82   FEV1-Predicted Pre % 86   FEV1-Post L 2.89   DLCO uncorrected ml/min/mmHg 28.39   DLCO UNC% % 110   DLCO corrected ml/min/mmHg 29.44   DLCO COR %Predicted % 114   DLVA Predicted % 121   TLC L 4.20   TLC % Predicted % 62   RV % Predicted % 14     Labs:  Path:  Echo:  Heart Catheterization:  Assessment & Plan:   Mild persistent asthma with acute exacerbation - Plan: mometasone-formoterol (DULERA) 100-5 MCG/ACT AERO  Discussion: Marcus Grant is a 67 year old male, never smoker with GERD and hypertension who returns to clinic.   Chronic Allergic Rhinitis/Reactive airways disease Stable with  occasional symptoms. Currently using Xolair, Sudafed, and over-the-counter Flonase-like nasal spray. -Continue current regimen. -Refill Dulera prescription.  Pneumococcal Vaccination Patient has not received pneumococcal vaccination and is of appropriate age and risk category. -Administer pneumococcal vaccine today. -Plan for booster in 5 years.  General Health Maintenance -Received COVID-19 and influenza vaccines. -Plan for annual follow-up unless condition changes.  Marcus Comas,  MD [facility] Office: [phone]   Current Outpatient Medications:    albuterol (PROVENTIL) (2.5 MG/3ML) 0.083% nebulizer solution, Take 3 mLs (2.5 mg total) by nebulization every 6 (six) hours as needed for wheezing or shortness of breath., Disp: 75 mL, Rfl: 12   albuterol (VENTOLIN HFA) 108 (90 Base) MCG/ACT inhaler, Inhale 1-2 puffs into the lungs every 6 (six) hours as needed., Disp: 8 g, Rfl: 2   fluticasone (FLONASE) 50 MCG/ACT nasal spray, Place 2 sprays into both nostrils daily., Disp: 16 g, Rfl: 6   lisinopril-hydrochlorothiazide (ZESTORETIC) 10-12.5 MG tablet, TAKE 1 TABLET BY MOUTH DAILY (MAX ON INS), Disp: 90 tablet, Rfl: 3   montelukast (SINGULAIR) 10 MG tablet, TAKE 1 TABLET AT BEDTIME BY MOUTH, Disp: 90 tablet, Rfl: 3   pantoprazole (PROTONIX) 40 MG tablet, Take 1 tablet (40 mg total) by mouth daily., Disp: 90 tablet, Rfl: 3   simvastatin (ZOCOR) 20 MG tablet, Take 1 tablet (20 mg total) by mouth daily., Disp: 90 tablet, Rfl: 3   Spacer/Aero-Holding Chambers (AEROCHAMBER MV) inhaler, Use as instructed, Disp: 1 each, Rfl: 0   mometasone-formoterol (DULERA) 100-5 MCG/ACT AERO, Inhale 2 puffs into the lungs 2 (two) times daily., Disp: 1 each, Rfl: 11

## 2023-08-15 NOTE — Patient Instructions (Addendum)
We will give you the Prevnar 20 shot today  Continue dulera 2 puffs twice daily - rinse mouth out after each use  Continue singulair daily  Continue flonase nasal spray   Follow up in 1 year, call sooner if needed

## 2023-08-17 ENCOUNTER — Encounter: Payer: Self-pay | Admitting: Pulmonary Disease

## 2023-08-20 ENCOUNTER — Telehealth: Payer: Self-pay | Admitting: Pulmonary Disease

## 2023-08-20 DIAGNOSIS — J4531 Mild persistent asthma with (acute) exacerbation: Secondary | ICD-10-CM

## 2023-08-20 MED ORDER — PREDNISONE 10 MG PO TABS: 40.0000 mg | ORAL_TABLET | Freq: Every day | ORAL | 0 refills | Status: DC

## 2023-08-20 MED ORDER — AZITHROMYCIN 250 MG PO TABS: ORAL_TABLET | ORAL | 0 refills | Status: DC

## 2023-08-20 NOTE — Telephone Encounter (Signed)
Patient states having symptoms of cough and chest congestion. Would like abx and steroids called into pharmacy. [facility]acy is [facility] [place]. Patient phone number is [phone].

## 2023-08-20 NOTE — Telephone Encounter (Signed)
Checking on prescriptions

## 2023-08-20 NOTE — Telephone Encounter (Signed)
I saw patient 5 days ago and he did not have these complaints.   Prednisone 40mg  daily for 5 days sent in. Zpak sent in.  JD

## 2023-08-20 NOTE — Telephone Encounter (Signed)
Spoke to patient.  He reports of non prod cough and chest/nasal congestion x1wk  Denied f/c/s or additional sx.  Dulera BID.  He is using flutter after neb treatments, which seems to be helping.   Neg covid test last week. He is leaving to go out of town tomorrow. He would like a Rx for abx and prednisone.   Dr. Francine Graven, please advise. Thanks

## 2023-08-21 NOTE — Telephone Encounter (Signed)
Called and spoke with pt, who states she already has picked up his prescription nfn

## 2023-08-21 NOTE — Telephone Encounter (Signed)
Called and spoke with patient, advised of recommendations per Dr. Francine Graven.  He stated he had picked the meds up at the pharmacy last night, he figured our office was closed for the day.  He was asking about a machine they used at the hospital to help break up the mucous (chest vest).  Advised that those are only approved for certain diagnosis.  Advised to use his nebulizer as needed to help break up the mucous and open up his airway.  He said his airway is much more open today than it was yesterday.  He knows to call the office back if he is not continuing to improve.  Nothing further needed.

## 2023-08-24 ENCOUNTER — Ambulatory Visit: Payer: Managed Care, Other (non HMO)

## 2023-08-24 ENCOUNTER — Encounter: Payer: Self-pay | Admitting: Primary Care

## 2023-08-24 ENCOUNTER — Ambulatory Visit (INDEPENDENT_AMBULATORY_CARE_PROVIDER_SITE_OTHER): Payer: Managed Care, Other (non HMO) | Admitting: Primary Care

## 2023-08-24 DIAGNOSIS — J4531 Mild persistent asthma with (acute) exacerbation: Secondary | ICD-10-CM | POA: Diagnosis not present

## 2023-08-24 MED ORDER — IPRATROPIUM BROMIDE 0.03 % NA SOLN: 2.0000 | Freq: Two times a day (BID) | NASAL | 1 refills | Status: DC

## 2023-08-24 MED ORDER — PREDNISONE 20 MG PO TABS: ORAL_TABLET | ORAL | 0 refills | Status: DC

## 2023-08-24 MED ORDER — MUCINEX DM 30-600 MG PO TB12: 2.0000 | ORAL_TABLET | Freq: Two times a day (BID) | ORAL | 0 refills | Status: DC

## 2023-08-24 MED ORDER — SODIUM CHLORIDE 3 % IN NEBU: INHALATION_SOLUTION | Freq: Two times a day (BID) | RESPIRATORY_TRACT | 0 refills | Status: AC | PRN

## 2023-08-24 MED ORDER — METHYLPREDNISOLONE ACETATE 80 MG/ML IJ SUSP
80.0000 mg | Freq: Once | INTRAMUSCULAR | Status: AC
  Administered 2023-08-24: 80 mg via INTRAMUSCULAR

## 2023-08-24 NOTE — Progress Notes (Unsigned)
@Patient  ID: Marcus Grant, male    DOB: 02/06/1956, 67 y.o.   MRN: [id number]  Chief Complaint  Patient presents with   Acute Visit    SOB with exertion, prod cough with white to yellow sputum- sx worsen at night.     Referring provider: Nelwyn Salisbury, MD  HPI: 67 year old male, never smoked.  Past medical history significant for hypertension, GERD, asthma, acquired pneumonia, history of respiratory failure, lung nodule.  Patient of Dr. Francine Graven, last seen on 08/15/2023.   08/24/2023 Patient presents today for acute office visit due to cough. He was recently seen by Dr. Francine Graven 1 week ago for regular follow-up for mild persistent asthma.  Contained on Dulera 100 mcg, Xolair, Flonase nasal spray and as needed Sudafed.  Non-productive cough and chest/nasal congestion x 1 week Using flutter valve after neb treatments  Covid test was negative  Neb as needed     Discussed the use of AI scribe software for clinical note transcription with the patient, who gave verbal consent to proceed.  History of Present Illness   The patient, with a history of respiratory issues, presents with persistent chest congestion, particularly at night, which has been disrupting his sleep. Despite a regimen of nebulizer treatments, Robitussin, antibiotics, and steroids initiated about a year ago during a hospital stay, the patient continues to experience these symptoms. He reports productive coughing, especially at night, and has been using a huff cough technique to aid expectoration.  The patient has been taking Sudafed twice daily and Singulair at night for allergies. He recently tried Mucinex 1200, which reportedly improved his sleep. He also reports significant postnasal drainage, which he has been managing with Flonase and nose drops.  The patient has been adhering to his treatment regimen, including the use of a nebulizer, and has been staying indoors to avoid exacerbating his symptoms. He has one  more day of steroids and has just finished his course of antibiotics. Despite these efforts, the patient reports persistent symptoms and is seeking a more potent treatment option.  The patient also mentions a recent cruise trip, after which he contracted rhinovirus, which he believes has exacerbated his respiratory issues. He denies any previous respiratory problems prior to this incident.         No Known Allergies  Immunization History  Administered Date(s) Administered   Influenza Inj Mdck Quad Pf 07/04/2019   Influenza Nasal 06/28/2017   Influenza Split 07/16/2012, 07/15/2013   Influenza, High Dose Seasonal PF 08/03/2023   Influenza, Seasonal, Injecte, Preservative Fre 06/29/2018   Influenza,inj,Quad PF,6+ Mos 06/29/2018, 06/30/2020, 09/26/2021, 09/12/2022   Influenza-Unspecified 06/25/2014, 07/05/2015, 06/25/2016   PFIZER(Purple Top)SARS-COV-2 Vaccination 01/10/2020, 02/04/2020, 09/11/2020   Pfizer Covid-19 Vaccine Bivalent Booster 109yrs & up 07/19/2021   Respiratory Syncytial Virus Vaccine,Recomb Aduvanted(Arexvy) 12/29/2022   Tdap 09/16/2018   Zoster Recombinant(Shingrix) 12/07/2017, 02/05/2018    Past Medical History:  Diagnosis Date   Asthma    Colon polyps    Diverticulosis    GERD (gastroesophageal reflux disease)    Hyperlipidemia    Hypertension     Tobacco History: Social History   Tobacco Use  Smoking Status Never  Smokeless Tobacco Never   Counseling given: Not Answered   Outpatient Medications Prior to Visit  Medication Sig Dispense Refill   albuterol (PROVENTIL) (2.5 MG/3ML) 0.083% nebulizer solution Take 3 mLs (2.5 mg total) by nebulization every 6 (six) hours as needed for wheezing or shortness of breath. 75 mL 12   albuterol (VENTOLIN HFA)  108 (90 Base) MCG/ACT inhaler Inhale 1-2 puffs into the lungs every 6 (six) hours as needed. 8 g 2   azithromycin (ZITHROMAX) 250 MG tablet Take as directed 6 tablet 0   fluticasone (FLONASE) 50 MCG/ACT nasal  spray Place 2 sprays into both nostrils daily. 16 g 6   lisinopril-hydrochlorothiazide (ZESTORETIC) 10-12.5 MG tablet TAKE 1 TABLET BY MOUTH DAILY (MAX ON INS) 90 tablet 3   mometasone-formoterol (DULERA) 100-5 MCG/ACT AERO Inhale 2 puffs into the lungs 2 (two) times daily. 1 each 11   montelukast (SINGULAIR) 10 MG tablet TAKE 1 TABLET AT BEDTIME BY MOUTH 90 tablet 3   pantoprazole (PROTONIX) 40 MG tablet Take 1 tablet (40 mg total) by mouth daily. 90 tablet 3   predniSONE (DELTASONE) 10 MG tablet Take 4 tablets (40 mg total) by mouth daily with breakfast. 20 tablet 0   simvastatin (ZOCOR) 20 MG tablet Take 1 tablet (20 mg total) by mouth daily. 90 tablet 3   Spacer/Aero-Holding Chambers (AEROCHAMBER MV) inhaler Use as instructed 1 each 0   No facility-administered medications prior to visit.      Review of Systems  Review of Systems  Constitutional: Negative.   HENT:  Positive for congestion and postnasal drip.   Respiratory:  Positive for cough.    Physical Exam  BP 126/70 (BP Location: Right Arm, Cuff Size: Normal)   Pulse 100   Temp 98 F (36.7 C) (Oral)   Ht 5' 9.5" (1.765 m)   Wt 213 lb (96.6 kg)   SpO2 95%   BMI 31.00 kg/m  Physical Exam Constitutional:      Appearance: Normal appearance.  HENT:     Head: Normocephalic and atraumatic.  Cardiovascular:     Rate and Rhythm: Normal rate and regular rhythm.  Pulmonary:     Breath sounds: Wheezing and rhonchi present.  Neurological:     General: No focal deficit present.     Mental Status: He is alert and oriented to person, place, and time. Mental status is at baseline.  Psychiatric:        Mood and Affect: Mood normal.        Behavior: Behavior normal.        Thought Content: Thought content normal.        Judgment: Judgment normal.      Lab Results:  CBC    Component Value Date/Time   WBC 5.9 09/12/2022 0839   RBC 5.15 09/12/2022 0839   HGB 15.8 09/12/2022 0839   HCT 46.0 09/12/2022 0839   PLT 302.0  09/12/2022 0839   MCV 89.3 09/12/2022 0839   MCH 30.1 07/13/2022 0540   MCHC 34.3 09/12/2022 0839   RDW 14.5 09/12/2022 0839   LYMPHSABS 1.6 09/12/2022 0839   MONOABS 0.5 09/12/2022 0839   EOSABS 0.1 09/12/2022 0839   BASOSABS 0.0 09/12/2022 0839    BMET    Component Value Date/Time   NA 139 09/12/2022 0839   K 4.8 09/12/2022 0839   CL 100 09/12/2022 0839   CO2 29 09/12/2022 0839   GLUCOSE 115 (H) 09/12/2022 0839   BUN 15 09/12/2022 0839   CREATININE 0.90 09/12/2022 0839   CALCIUM 9.8 09/12/2022 0839   GFRNONAA >60 07/13/2022 0540   GFRAA 114 11/27/2008 0940    BNP    Component Value Date/Time   BNP 17.3 07/08/2022 1920    ProBNP No results found for: "PROBNP"  Imaging: No results found.   Assessment & Plan:   1. Mild  persistent asthma with acute exacerbation - predniSONE (DELTASONE) 20 MG tablet; Take 2 tablets x 5 days, then 1 tablet x 5 days then stop  Dispense: 15 tablet; Refill: 0 - DG Chest 2 View; Future      Acute bronchitis with asthma exacerbation  Persistent chest congestion and postnasal drip despite current regimen of Robitussin, Sudafed, Singulair, and a recent course of antibiotics and steroids. Patient has inspiratory wheezing on exam with some scattered rhonchi  -Administer Depo Medrol 80mg  IM\ injection today. -Extend Prednisone taper for a few more days  -Replace Robitussin with Mucinx-DM 1200mg  twice daily . -Add hypertonic saline neb twice a day as needed for cough/loosen congestion -Add Ipatropium nasal spray to current regimen. -Order chest x-ray to assess for any underlying lung pathology. -Consider a different antibiotic if no improvement or if chest x-ray is abnormal.  General Health Maintenance -Discussed the need for a pneumonia vaccine (Prevnar) in the future, but not while currently ill.     Glenford Bayley, NP 08/24/2023

## 2023-08-24 NOTE — Patient Instructions (Addendum)
Recommendations: Continue Dulera two puffs morning and evening (rinse mouth afteruse) Start Mucinex-dm 1,200mg  twice daily until symptoms are better  Start Astelin nasal spray twice daily until symptoms are better  Extend prednisone (start tomorrow)   Office visit: Depo-medrol 80mg  x1   Orders: Extend prednisone CXR today  Follow-up: If not better please call me

## 2023-08-24 NOTE — Progress Notes (Signed)
CXR showed clear lungs, no acute process

## 2023-08-31 ENCOUNTER — Telehealth: Payer: Managed Care, Other (non HMO) | Admitting: Pulmonary Disease

## 2023-08-31 DIAGNOSIS — J4541 Moderate persistent asthma with (acute) exacerbation: Secondary | ICD-10-CM

## 2023-08-31 DIAGNOSIS — K219 Gastro-esophageal reflux disease without esophagitis: Secondary | ICD-10-CM

## 2023-08-31 DIAGNOSIS — I10 Essential (primary) hypertension: Secondary | ICD-10-CM

## 2023-08-31 MED ORDER — BREZTRI AEROSPHERE 160-9-4.8 MCG/ACT IN AERO: 2.0000 | INHALATION_SPRAY | Freq: Two times a day (BID) | RESPIRATORY_TRACT | 11 refills | Status: DC

## 2023-08-31 MED ORDER — IPRATROPIUM-ALBUTEROL 0.5-2.5 (3) MG/3ML IN SOLN: 3.0000 mL | RESPIRATORY_TRACT | 2 refills | Status: DC | PRN

## 2023-08-31 MED ORDER — NYSTATIN 100000 UNIT/ML MT SUSP: 10.0000 mL | Freq: Three times a day (TID) | OROMUCOSAL | 0 refills | Status: DC | PRN

## 2023-08-31 NOTE — Patient Instructions (Signed)
VISIT SUMMARY:  You were seen today for ongoing chest congestion and a rattling sensation in your chest, which have persisted for about a week and a half. Despite previous treatments, your symptoms are worse at night and have been disrupting your sleep. You also reported a possible case of thrush, likely due to your current medications. Additionally, we reviewed your history of GERD and hypertension.  YOUR PLAN:  -CHRONIC RESPIRATORY SYMPTOMS: You have persistent chest congestion and cough, which are worse at night. This may be due to your recent illnesses and treatments. We are changing your inhaler to [facility] and your nebulizer treatment to Duoneb to help open your bronchial tubes. Continue using Mucinex as needed. We have also ordered a mouthwash to treat the possible thrush.  -GERD: GERD is a condition where stomach acid frequently flows back into the tube connecting your mouth and stomach. Continue taking Protonix as prescribed to manage your acid reflux.  -HYPERTENSION: Hypertension is high blood pressure. Continue taking Lisinopril as prescribed to manage your blood pressure.  INSTRUCTIONS:  Follow up as needed if your symptoms persist or worsen.

## 2023-08-31 NOTE — Progress Notes (Signed)
Marcus Grant    [id number]    08-19-1956  Primary Care Physician:Fry, Tera Mater, MD  Referring Physician: Nelwyn Salisbury, MD [address] [place],  [place] [address]  Chief complaint: Acute visit for cough, dyspnea  HPI: 67 y.o. who  has a past medical history of Asthma, Colon polyps, Diverticulosis, GERD (gastroesophageal reflux disease), Hyperlipidemia, and Hypertension.   Discussed the use of AI scribe software for clinical note transcription with the patient, who gave verbal consent to proceed.  The patient, with a history of rhinovirus and COVID-19 in 2023, presents with ongoing deep chest congestion and a rattling sensation in the chest. The symptoms have persisted for approximately a week and a half, despite a course of steroids and antibiotics. The patient reports that the symptoms are worse at night, leading to disrupted sleep. The patient had been prescribed Z-Pak and prednisone over the past month and then seen by our nurse practitioner with extension of prednisone dose, hypertonic saline nebulizer initiation.  He cannot tolerate the hypertonic saline nebulizer and feels some mild improvement but is seeking additional treatment to help open up the bronchial tubes.  Chest x-ray on 11/15 did not show any acute abnormality.  The patient also reports a possible case of thrush, which he attributes to the combination of medications he is currently taking. The patient has a history of GERD and is on Protonix for acid reflux. The patient also mentions having asthma as a child, and wonders if his immune system has been compromised since his illness last year, making him more susceptible to respiratory issues.    Outpatient Encounter Medications as of 08/31/2023  Medication Sig   Budeson-Glycopyrrol-Formoterol (BREZTRI AEROSPHERE) 160-9-4.8 MCG/ACT AERO Inhale 2 puffs into the lungs in the morning and at bedtime.   ipratropium-albuterol (DUONEB) 0.5-2.5 (3) MG/3ML  SOLN Take 3 mLs by nebulization every 4 (four) hours as needed.   magic mouthwash (nystatin, lidocaine, diphenhydrAMINE, alum & mag hydroxide) suspension Swish and swallow 10 mLs 3 (three) times daily as needed for mouth pain.   albuterol (VENTOLIN HFA) 108 (90 Base) MCG/ACT inhaler Inhale 1-2 puffs into the lungs every 6 (six) hours as needed.   azithromycin (ZITHROMAX) 250 MG tablet Take as directed   Dextromethorphan-guaiFENesin (MUCINEX DM) 30-600 MG TB12 Take 2 tablets by mouth 2 (two) times daily.   fluticasone (FLONASE) 50 MCG/ACT nasal spray Place 2 sprays into both nostrils daily.   ipratropium (ATROVENT) 0.03 % nasal spray Place 2 sprays into both nostrils 2 (two) times daily.   lisinopril-hydrochlorothiazide (ZESTORETIC) 10-12.5 MG tablet TAKE 1 TABLET BY MOUTH DAILY (MAX ON INS)   montelukast (SINGULAIR) 10 MG tablet TAKE 1 TABLET AT BEDTIME BY MOUTH   pantoprazole (PROTONIX) 40 MG tablet Take 1 tablet (40 mg total) by mouth daily.   predniSONE (DELTASONE) 20 MG tablet Take 2 tablets x 5 days, then 1 tablet x 5 days then stop   simvastatin (ZOCOR) 20 MG tablet Take 1 tablet (20 mg total) by mouth daily.   sodium chloride HYPERTONIC 3 % nebulizer solution Take by nebulization 2 (two) times daily as needed for cough.   Spacer/Aero-Holding Chambers (AEROCHAMBER MV) inhaler Use as instructed   [DISCONTINUED] albuterol (PROVENTIL) (2.5 MG/3ML) 0.083% nebulizer solution Take 3 mLs (2.5 mg total) by nebulization every 6 (six) hours as needed for wheezing or shortness of breath.   [DISCONTINUED] mometasone-formoterol (DULERA) 100-5 MCG/ACT AERO Inhale 2 puffs into the lungs 2 (two) times  daily.   No facility-administered encounter medications on file as of 08/31/2023.   Physical Exam: There were no vitals taken for this visit. Gen:      No acute distress HEENT:  EOMI, sclera anicteric Neck:     No masses; no thyromegaly Lungs:    Clear to auscultation bilaterally; normal respiratory  effort CV:         Regular rate and rhythm; no murmurs Abd:      + bowel sounds; soft, non-tender; no palpable masses, no distension Ext:    No edema; adequate peripheral perfusion Skin:      Warm and dry; no rash Neuro: alert and oriented x 3 Psych: normal mood and affect  Data Reviewed: Imaging: Chest x-ray left/15/2024-now acute abnormality.  No pneumonia noted I have reviewed the images personally  Assessment and Plan Asthma with acute exacerbation. Persistent chest congestion and cough, worse at night. Recent treatment with Z-Pak, prednisone, and Mucinex. Possible thrush secondary to nebulizer use. No evidence of pneumonia on recent imaging.  -Change inhaler from [facility] to [place].  No need for additional prednisone -Change nebulizer treatment from albuterol to Duoneb. -Continue Mucinex as needed. -Order mouthwash for thrush.  GERD Long-standing history, currently managed with Protonix. -Continue Protonix as prescribed.  Hypertension Long-standing history, managed with Lisinopril.  Difficult to attribute current symptoms to lisinopril as he has been on it for over 10 years. -Continue Lisinopril as prescribed.  Follow-up as needed for persistent or worsening symptoms.   Recommendations: Breztri DuoNebs Mucinex Nystatin mouthwash for thrush  Chilton Greathouse MD [facility] 08/31/2023, 2:48 PM  CC: Nelwyn Salisbury, MD

## 2023-09-05 ENCOUNTER — Telehealth: Payer: Self-pay | Admitting: Family Medicine

## 2023-09-05 DIAGNOSIS — J4531 Mild persistent asthma with (acute) exacerbation: Secondary | ICD-10-CM

## 2023-09-05 NOTE — Telephone Encounter (Signed)
I did the referral 

## 2023-09-05 NOTE — Telephone Encounter (Signed)
Pt called to inform MD that he is not happy with his current Pulmonologist, and would like to request another referral.  Preferably to see:   Dr. Coralyn Helling Pulmonology @ [facility] [place] [address] [place], [place] [address] [phone]

## 2023-09-10 NOTE — Telephone Encounter (Signed)
Contacted pt to update him. Pt reports he has already have appt for the referral. No further action is needed.

## 2023-09-15 ENCOUNTER — Other Ambulatory Visit: Payer: Self-pay | Admitting: Primary Care

## 2023-09-18 ENCOUNTER — Encounter: Payer: Self-pay | Admitting: Family Medicine

## 2023-09-18 ENCOUNTER — Telehealth: Payer: Self-pay | Admitting: Family Medicine

## 2023-09-18 ENCOUNTER — Ambulatory Visit: Payer: Managed Care, Other (non HMO) | Admitting: Family Medicine

## 2023-09-18 VITALS — BP 98/60 | HR 83 | Temp 98.3°F | Ht 68.75 in | Wt 207.0 lb

## 2023-09-18 DIAGNOSIS — Z23 Encounter for immunization: Secondary | ICD-10-CM | POA: Diagnosis not present

## 2023-09-18 DIAGNOSIS — Z0000 Encounter for general adult medical examination without abnormal findings: Secondary | ICD-10-CM | POA: Diagnosis not present

## 2023-09-18 LAB — CBC WITH DIFFERENTIAL/PLATELET
Basophils Absolute: 0.1 10*3/uL (ref 0.0–0.1)
Basophils Relative: 0.7 % (ref 0.0–3.0)
Eosinophils Absolute: 0.2 10*3/uL (ref 0.0–0.7)
Eosinophils Relative: 3.2 % (ref 0.0–5.0)
HCT: 46.1 % (ref 39.0–52.0)
Hemoglobin: 15.2 g/dL (ref 13.0–17.0)
Lymphocytes Relative: 21.9 % (ref 12.0–46.0)
Lymphs Abs: 1.6 10*3/uL (ref 0.7–4.0)
MCHC: 33 g/dL (ref 30.0–36.0)
MCV: 89.8 fL (ref 78.0–100.0)
Monocytes Absolute: 0.5 10*3/uL (ref 0.1–1.0)
Monocytes Relative: 6.9 % (ref 3.0–12.0)
Neutro Abs: 4.8 10*3/uL (ref 1.4–7.7)
Neutrophils Relative %: 67.3 % (ref 43.0–77.0)
Platelets: 394 10*3/uL (ref 150.0–400.0)
RBC: 5.13 Mil/uL (ref 4.22–5.81)
RDW: 14.4 % (ref 11.5–15.5)
WBC: 7.1 10*3/uL (ref 4.0–10.5)

## 2023-09-18 LAB — LIPID PANEL
Cholesterol: 188 mg/dL (ref 0–200)
HDL: 55 mg/dL (ref >39.00)
LDL Cholesterol: 98 mg/dL (ref 0–99)
NonHDL: 133.26
Total CHOL/HDL Ratio: 3
Triglycerides: 178 mg/dL — ABNORMAL HIGH (ref 0.0–149.0)
VLDL: 35.6 mg/dL (ref 0.0–40.0)

## 2023-09-18 LAB — HEMOGLOBIN A1C: Hgb A1c MFr Bld: 6.9 % — ABNORMAL HIGH (ref 4.6–6.5)

## 2023-09-18 LAB — HEPATIC FUNCTION PANEL
ALT: 19 U/L (ref 0–53)
AST: 13 U/L (ref 0–37)
Albumin: 4.1 g/dL (ref 3.5–5.2)
Alkaline Phosphatase: 59 U/L (ref 39–117)
Bilirubin, Direct: 0.1 mg/dL (ref 0.0–0.3)
Total Bilirubin: 0.7 mg/dL (ref 0.2–1.2)
Total Protein: 6.8 g/dL (ref 6.0–8.3)

## 2023-09-18 LAB — BASIC METABOLIC PANEL
BUN: 17 mg/dL (ref 6–23)
CO2: 32 meq/L (ref 19–32)
Calcium: 9.5 mg/dL (ref 8.4–10.5)
Chloride: 99 meq/L (ref 96–112)
Creatinine, Ser: 0.82 mg/dL (ref 0.40–1.50)
GFR: 91.9 mL/min (ref >60.00)
Glucose, Bld: 98 mg/dL (ref 70–99)
Potassium: 4.7 meq/L (ref 3.5–5.1)
Sodium: 138 meq/L (ref 135–145)

## 2023-09-18 LAB — PSA: PSA: 2.23 ng/mL (ref 0.10–4.00)

## 2023-09-18 LAB — TSH: TSH: 1.67 u[IU]/mL (ref 0.35–5.50)

## 2023-09-18 MED ORDER — SIMVASTATIN 20 MG PO TABS: 20.0000 mg | ORAL_TABLET | Freq: Every day | ORAL | 3 refills | Status: DC

## 2023-09-18 MED ORDER — LISINOPRIL 10 MG PO TABS: 10.0000 mg | ORAL_TABLET | Freq: Every day | ORAL | 3 refills | Status: DC

## 2023-09-18 MED ORDER — PANTOPRAZOLE SODIUM 40 MG PO TBEC: 40.0000 mg | DELAYED_RELEASE_TABLET | Freq: Every day | ORAL | 3 refills | Status: DC

## 2023-09-18 MED ORDER — MONTELUKAST SODIUM 10 MG PO TABS: ORAL_TABLET | ORAL | 3 refills | Status: DC

## 2023-09-18 NOTE — Addendum Note (Signed)
Addended by: Carola Rhine on: 09/18/2023 11:51 AM   Modules accepted: Orders

## 2023-09-18 NOTE — Telephone Encounter (Signed)
Pt was just seen by MD today, for his CPE. Pt wanted to remind MD to please send Rx for Cialis, as per today's discussion.  [id number] [place], [place] -  [address] AT [place] [address] Phone: [phone]  Fax: [phone]

## 2023-09-18 NOTE — Progress Notes (Addendum)
Subjective:    Patient ID: Moussa Kowalchuk, male    DOB: 03-28-1956, 67 y.o.   MRN: [id number]  HPI Here for a well exam. He has no complaints. He has been working on his diet, and he has lost 11 lbs overt he past year. Consequently his BP has been somewhat low.    Review of Systems  Constitutional: Negative.   HENT: Negative.    Eyes: Negative.   Respiratory: Negative.    Cardiovascular: Negative.   Gastrointestinal: Negative.   Genitourinary: Negative.   Musculoskeletal: Negative.   Skin: Negative.   Neurological: Negative.   Psychiatric/Behavioral: Negative.         Objective:   Physical Exam Constitutional:      General: He is not in acute distress.    Appearance: Normal appearance. He is well-developed. He is not diaphoretic.  HENT:     Head: Normocephalic and atraumatic.     Right Ear: External ear normal.     Left Ear: External ear normal.     Nose: Nose normal.     Mouth/Throat:     Pharynx: No oropharyngeal exudate.  Eyes:     General: No scleral icterus.       Right eye: No discharge.        Left eye: No discharge.     Conjunctiva/sclera: Conjunctivae normal.     Pupils: Pupils are equal, round, and reactive to light.  Neck:     Thyroid: No thyromegaly.     Vascular: No JVD.     Trachea: No tracheal deviation.  Cardiovascular:     Rate and Rhythm: Normal rate and regular rhythm.     Pulses: Normal pulses.     Heart sounds: Normal heart sounds. No murmur heard.    No friction rub. No gallop.  Pulmonary:     Effort: Pulmonary effort is normal. No respiratory distress.     Breath sounds: Normal breath sounds. No wheezing or rales.  Chest:     Chest wall: No tenderness.  Abdominal:     General: Bowel sounds are normal. There is no distension.     Palpations: Abdomen is soft. There is no mass.     Tenderness: There is no abdominal tenderness. There is no guarding or rebound.     Comments: Has a non-tender easily reducible umbilical hernia    Genitourinary:    Penis: Normal. No tenderness.      Testes: Normal.     Prostate: Normal.     Rectum: Normal. Guaiac result negative.  Musculoskeletal:        General: No tenderness. Normal range of motion.     Cervical back: Neck supple.  Lymphadenopathy:     Cervical: No cervical adenopathy.  Skin:    General: Skin is warm and dry.     Coloration: Skin is not pale.     Findings: No erythema or rash.  Neurological:     General: No focal deficit present.     Mental Status: He is alert and oriented to person, place, and time.     Cranial Nerves: No cranial nerve deficit.     Motor: No abnormal muscle tone.     Coordination: Coordination normal.     Deep Tendon Reflexes: Reflexes are normal and symmetric. Reflexes normal.  Psychiatric:        Mood and Affect: Mood normal.        Behavior: Behavior normal.        Thought Content: Thought  content normal.        Judgment: Judgment normal.           Assessment & Plan:  Well exam. We discussed diet and exercise. Get fasting labs. We will stop the Lisinopril HCT and change to Lisinopril 10 mg daily.  Gershon Crane, MD

## 2023-09-19 MED ORDER — TADALAFIL 20 MG PO TABS: 20.0000 mg | ORAL_TABLET | ORAL | 11 refills | Status: AC | PRN

## 2023-09-19 NOTE — Telephone Encounter (Signed)
Done

## 2023-10-24 ENCOUNTER — Telehealth: Payer: Self-pay | Admitting: Pulmonary Disease

## 2023-10-24 DIAGNOSIS — J4541 Moderate persistent asthma with (acute) exacerbation: Secondary | ICD-10-CM

## 2023-10-24 NOTE — Telephone Encounter (Signed)
 Patient is having head and chest congestion. He has been using a nebulizer and mucinex .  He would like some prednisone  20mg , zpak , azithromycin  and antibiotic.   [facility]

## 2023-10-25 MED ORDER — PREDNISONE 10 MG PO TABS: ORAL_TABLET | ORAL | 0 refills | Status: AC

## 2023-10-25 MED ORDER — AZITHROMYCIN 250 MG PO TABS: ORAL_TABLET | ORAL | 0 refills | Status: DC

## 2023-10-25 NOTE — Telephone Encounter (Signed)
Pt states people at work have been donating stuff, and he thinks he may have caught something from that. Pt states he was on antibiotics and steroids last time, and he is still taking his inhalers. Pt states this started Sunday. Pt has a runny nose. Pt was having chest tightness, coughing up green mucous. Pt states he had to sit up in a recliner 2 nights ago so it would not worsen. Last time this was a bronchial issue. Pt states in the beginning, he had a low grade fever, highest being 99.0. No sore throat. Denies being tested for Covid, flu, and rsv recently. He has been using a nebulizer and mucinex. He would like some prednisone 20mg , zpak , azithromycin and antibiotic. Please Advise.

## 2023-10-25 NOTE — Telephone Encounter (Signed)
ATC pt X1. Lvm asking for a call back.

## 2023-10-25 NOTE — Telephone Encounter (Signed)
Patient was informed. NFN.

## 2023-10-25 NOTE — Telephone Encounter (Signed)
Extended taper and Zpak sent to pharmacy.  JD

## 2023-11-01 ENCOUNTER — Other Ambulatory Visit: Payer: Self-pay | Admitting: Family Medicine

## 2023-11-01 DIAGNOSIS — Z0000 Encounter for general adult medical examination without abnormal findings: Secondary | ICD-10-CM

## 2023-11-05 NOTE — Telephone Encounter (Signed)
PT still not well even after RX sent in by Dr. No appts avail.Still has a bad cough. Keeping him up. Still doing neb tx and on his last pred. Sleeps in a recliner at night. His [phone]  [facility] [place].

## 2023-11-05 NOTE — Telephone Encounter (Signed)
Spoke with the pt and scheduled acute visit with Dr Wynona Neat for tomorrow  Nothing further needed

## 2023-11-06 ENCOUNTER — Encounter: Payer: Self-pay | Admitting: Pulmonary Disease

## 2023-11-06 ENCOUNTER — Ambulatory Visit (INDEPENDENT_AMBULATORY_CARE_PROVIDER_SITE_OTHER): Payer: Managed Care, Other (non HMO) | Admitting: Pulmonary Disease

## 2023-11-06 VITALS — BP 126/68 | HR 98 | Temp 98.1°F | Ht 69.0 in | Wt 208.2 lb

## 2023-11-06 DIAGNOSIS — Z8616 Personal history of COVID-19: Secondary | ICD-10-CM | POA: Diagnosis not present

## 2023-11-06 DIAGNOSIS — R059 Cough, unspecified: Secondary | ICD-10-CM | POA: Diagnosis not present

## 2023-11-06 DIAGNOSIS — J4541 Moderate persistent asthma with (acute) exacerbation: Secondary | ICD-10-CM

## 2023-11-06 MED ORDER — PREDNISONE 20 MG PO TABS: 20.0000 mg | ORAL_TABLET | Freq: Every day | ORAL | 0 refills | Status: DC

## 2023-11-06 MED ORDER — AMOXICILLIN-POT CLAVULANATE 875-125 MG PO TABS: 1.0000 | ORAL_TABLET | Freq: Two times a day (BID) | ORAL | 0 refills | Status: DC

## 2023-11-06 MED ORDER — ALBUTEROL SULFATE HFA 108 (90 BASE) MCG/ACT IN AERS: 2.0000 | INHALATION_SPRAY | Freq: Four times a day (QID) | RESPIRATORY_TRACT | 6 refills | Status: AC | PRN

## 2023-11-06 MED ORDER — BREZTRI AEROSPHERE 160-9-4.8 MCG/ACT IN AERO: 2.0000 | INHALATION_SPRAY | Freq: Two times a day (BID) | RESPIRATORY_TRACT | 3 refills | Status: AC

## 2023-11-06 NOTE — Progress Notes (Signed)
Marcus Grant    [id number]    25-May-1956  Primary Care Physician:Fry, Tera Mater, MD  Referring Physician: Nelwyn Salisbury, MD [address] [place],  [place] [address]  Chief complaint:   Patient in with complaints of shortness of breath, cough, congestion  HPI:  Underlying history of asthma Had asthma as a child, outgrew it for many years and since he had COVID a few years back has been having persistent shortness of breath coughing Multiple antibiotics, steroids, his breathing was stabilized for a little while and then recur  He is short of breath with most activities  Cough, congestion Cough is usually dry but sometimes with clear to yellow phlegm  He is not feeling acutely ill, denies any fevers or chills Has not really been around anybody with a febrile illness  He does have chronic sinus fullness and congestion  Outpatient Encounter Medications as of 11/06/2023  Medication Sig   ipratropium (ATROVENT) 0.03 % nasal spray PLACE 2 SPRAYS INTO BOTH NOSTRILS 2 (TWO) TIMES DAILY   lisinopril (ZESTRIL) 10 MG tablet Take 1 tablet (10 mg total) by mouth daily.   montelukast (SINGULAIR) 10 MG tablet TAKE 1 TABLET AT BEDTIME BY MOUTH   pantoprazole (PROTONIX) 40 MG tablet Take 1 tablet (40 mg total) by mouth daily.   predniSONE (DELTASONE) 10 MG tablet Take 4 tablets (40 mg total) by mouth daily with breakfast for 3 days, THEN 3 tablets (30 mg total) daily with breakfast for 3 days, THEN 2 tablets (20 mg total) daily with breakfast for 3 days, THEN 1 tablet (10 mg total) daily with breakfast for 3 days.   simvastatin (ZOCOR) 20 MG tablet Take 1 tablet (20 mg total) by mouth daily.   sodium chloride HYPERTONIC 3 % nebulizer solution Take by nebulization 2 (two) times daily as needed for cough.   tadalafil (CIALIS) 20 MG tablet Take 1 tablet (20 mg total) by mouth as needed for erectile dysfunction.   azithromycin (ZITHROMAX) 250 MG tablet Take as directed  (Patient not taking: Reported on 11/06/2023)   No facility-administered encounter medications on file as of 11/06/2023.    Allergies as of 11/06/2023   (No Known Allergies)    Past Medical History:  Diagnosis Date   Asthma    Colon polyps    Diverticulosis    GERD (gastroesophageal reflux disease)    Hyperlipidemia    Hypertension     Past Surgical History:  Procedure Laterality Date   COLONOSCOPY  08/10/2022   per Dr.  Russella Dar, adenomatous polyps, repeat in 3 yrs   RETINAL DETACHMENT REPAIR W/ SCLERAL BUCKLE LE  2012   per Dr. Champ Mungo in [place]     Family History  Problem Relation Age of Onset   Dementia Mother    Colon cancer Father    Colon polyps Father    Coronary artery disease Other    Colon polyps Other    Esophageal cancer Neg Hx    Stomach cancer Neg Hx    Rectal cancer Neg Hx     Social History   Socioeconomic History   Marital status: Married    Spouse name: Not on file   Number of children: Not on file   Years of education: Not on file   Highest education level: Associate degree: occupational, Scientist, product/process development, or vocational program  Occupational History   Not on file  Tobacco Use   Smoking status: Never   Smokeless tobacco: Never  Vaping Use   Vaping status: Never Used  Substance and Sexual Activity   Alcohol use: Yes    Alcohol/week: 2.0 standard drinks of alcohol    Types: 2 Shots of liquor per week    Comment: 2 drinks weekly   Drug use: No   Sexual activity: Not on file  Other Topics Concern   Not on file  Social History Narrative   Not on file   Social Drivers of Health   Financial Resource Strain: Low Risk  (09/17/2023)   Overall Financial Resource Strain (CARDIA)    Difficulty of Paying Living Expenses: Not hard at all  Food Insecurity: No Food Insecurity (09/17/2023)   Hunger Vital Sign    Worried About Running Out of Food in the Last Year: Never true    Ran Out of Food in the Last Year: Never true  Transportation Needs: No  Transportation Needs (09/17/2023)   PRAPARE - Administrator, Civil Service (Medical): No    Lack of Transportation (Non-Medical): No  Physical Activity: Insufficiently Active (09/17/2023)   Exercise Vital Sign    Days of Exercise per Week: 1 day    Minutes of Exercise per Session: 30 min  Stress: No Stress Concern Present (09/17/2023)   [employer] of Occupational Health - Occupational Stress Questionnaire    Feeling of Stress : Not at all  Social Connections: Moderately Integrated (09/17/2023)   Social Connection and Isolation Panel [NHANES]    Frequency of Communication with Friends and Family: Twice a week    Frequency of Social Gatherings with Friends and Family: Once a week    Attends Religious Services: More than 4 times per year    Active Member of [employer] or Organizations: No    Attends Banker Meetings: Not on file    Marital Status: Married  Catering manager Violence: Not At Risk (07/10/2022)   Humiliation, Afraid, Rape, and Kick questionnaire    Fear of Current or Ex-Partner: No    Emotionally Abused: No    Physically Abused: No    Sexually Abused: No    Review of Systems  Respiratory:  Positive for cough and shortness of breath.     Vitals:   11/06/23 1118  BP: 126/68  Pulse: 98  Temp: 98.1 F (36.7 C)  SpO2: 94%     Physical Exam Constitutional:      Appearance: He is obese.  HENT:     Head: Normocephalic.     Mouth/Throat:     Mouth: Mucous membranes are moist.  Cardiovascular:     Rate and Rhythm: Normal rate and regular rhythm.     Heart sounds:     No friction rub.  Pulmonary:     Effort: No respiratory distress.     Breath sounds: No stridor. Rhonchi present. No wheezing.  Musculoskeletal:     Cervical back: No rigidity or tenderness.  Neurological:     Mental Status: He is alert.  Psychiatric:        Mood and Affect: Mood normal.    Data Reviewed: Recent chest x-ray reviewed showing prominent bronchovascular  markings  CT chest 07/08/2022-prednisone thick mucus and some airways, bronchial wall thickening with mucous plugging  Assessment:  Post-COVID airway hyperresponsiveness  History of asthma  Persistent cough, congestion, sinus fullness  Plan/Recommendations: Encouraged to continue Breztri 2 puffs twice a day  Rescue inhaler use as needed  Will call in a prescription for azithromycin and prednisone  Follow-up in 3 months  Encouraged to call with significant concerns  May use over-the-counter Mucinex for cough and congestion  Virl Diamond MD [facility] 11/06/2023, 11:27 AM  CC: Nelwyn Salisbury, MD

## 2023-11-06 NOTE — Patient Instructions (Addendum)
Continue using Breztri  Albuterol rescue inhaler use 2 puffs up to 4 times a day as needed  Will call in a prescription for antibiotic and steroid for you to have a hand hand  Follow-up in 3 months  Call us with significant concerns  You may use over-the-counter Mucinex to help with cough and congestion

## 2024-02-04 ENCOUNTER — Ambulatory Visit: Payer: Managed Care, Other (non HMO) | Admitting: Pulmonary Disease

## 2024-06-16 ENCOUNTER — Other Ambulatory Visit: Payer: Self-pay

## 2024-06-16 ENCOUNTER — Telehealth: Payer: Self-pay | Admitting: *Deleted

## 2024-06-16 DIAGNOSIS — Z23 Encounter for immunization: Secondary | ICD-10-CM

## 2024-06-16 NOTE — Telephone Encounter (Signed)
 Copied from CRM [id number]. Topic: Clinical - Medication Question >> Jun 16, 2024  8:58 AM Carlatta H wrote: Reason for CRM: Patient would like COVID vaccine prescription to the clinic//Please call  [phone] to advise the patient

## 2024-07-03 ENCOUNTER — Telehealth: Payer: Self-pay

## 2024-07-03 DIAGNOSIS — Z0000 Encounter for general adult medical examination without abnormal findings: Secondary | ICD-10-CM

## 2024-07-03 NOTE — Telephone Encounter (Signed)
 Copied from CRM [id number]. Topic: Clinical - Request for Lab/Test Order >> Jun 27, 2024  9:17 AM Berneda FALCON wrote: Reason for CRM: Pt would like labs ordered for his physical so he can schedule a week before physical.

## 2024-07-04 NOTE — Telephone Encounter (Signed)
 Done

## 2024-07-04 NOTE — Addendum Note (Signed)
 Addended by: JOHNNY SENIOR A on: 07/04/2024 09:54 AM   Modules accepted: Orders

## 2024-08-31 ENCOUNTER — Other Ambulatory Visit: Payer: Self-pay | Admitting: Family Medicine

## 2024-09-10 ENCOUNTER — Other Ambulatory Visit: Payer: Self-pay | Admitting: Family Medicine

## 2024-09-10 DIAGNOSIS — Z0000 Encounter for general adult medical examination without abnormal findings: Secondary | ICD-10-CM

## 2024-09-20 ENCOUNTER — Other Ambulatory Visit: Payer: Self-pay | Admitting: Family Medicine

## 2024-09-20 DIAGNOSIS — Z0000 Encounter for general adult medical examination without abnormal findings: Secondary | ICD-10-CM

## 2024-09-22 ENCOUNTER — Other Ambulatory Visit

## 2024-09-22 DIAGNOSIS — Z0000 Encounter for general adult medical examination without abnormal findings: Secondary | ICD-10-CM | POA: Diagnosis not present

## 2024-09-22 LAB — PSA: PSA: 0.97 ng/mL (ref 0.10–4.00)

## 2024-09-22 LAB — CBC WITH DIFFERENTIAL/PLATELET
Basophils Absolute: 0.1 K/uL (ref 0.0–0.1)
Basophils Relative: 1 % (ref 0.0–3.0)
Eosinophils Absolute: 0.1 K/uL (ref 0.0–0.7)
Eosinophils Relative: 1.9 % (ref 0.0–5.0)
HCT: 45.7 % (ref 39.0–52.0)
Hemoglobin: 15.3 g/dL (ref 13.0–17.0)
Lymphocytes Relative: 26.2 % (ref 12.0–46.0)
Lymphs Abs: 1.7 K/uL (ref 0.7–4.0)
MCHC: 33.5 g/dL (ref 30.0–36.0)
MCV: 89.1 fl (ref 78.0–100.0)
Monocytes Absolute: 0.5 K/uL (ref 0.1–1.0)
Monocytes Relative: 7.3 % (ref 3.0–12.0)
Neutro Abs: 4.2 K/uL (ref 1.4–7.7)
Neutrophils Relative %: 63.6 % (ref 43.0–77.0)
Platelets: 305 K/uL (ref 150.0–400.0)
RBC: 5.12 Mil/uL (ref 4.22–5.81)
RDW: 13.7 % (ref 11.5–15.5)
WBC: 6.6 K/uL (ref 4.0–10.5)

## 2024-09-22 LAB — LIPID PANEL
Cholesterol: 134 mg/dL (ref 0–200)
HDL: 44.1 mg/dL (ref >39.00)
LDL Cholesterol: 64 mg/dL (ref 0–99)
NonHDL: 90.26
Total CHOL/HDL Ratio: 3
Triglycerides: 132 mg/dL (ref 0.0–149.0)
VLDL: 26.4 mg/dL (ref 0.0–40.0)

## 2024-09-22 LAB — BASIC METABOLIC PANEL WITH GFR
BUN: 12 mg/dL (ref 6–23)
CO2: 31 meq/L (ref 19–32)
Calcium: 9.5 mg/dL (ref 8.4–10.5)
Chloride: 101 meq/L (ref 96–112)
Creatinine, Ser: 0.88 mg/dL (ref 0.40–1.50)
GFR: 89.32 mL/min (ref >60.00)
Glucose, Bld: 110 mg/dL — ABNORMAL HIGH (ref 70–99)
Potassium: 5 meq/L (ref 3.5–5.1)
Sodium: 141 meq/L (ref 135–145)

## 2024-09-22 LAB — HEMOGLOBIN A1C: Hgb A1c MFr Bld: 6.2 % (ref 4.6–6.5)

## 2024-09-22 LAB — TSH: TSH: 1.64 u[IU]/mL (ref 0.35–5.50)

## 2024-09-22 LAB — HEPATIC FUNCTION PANEL
ALT: 20 U/L (ref 0–53)
AST: 14 U/L (ref 0–37)
Albumin: 4.4 g/dL (ref 3.5–5.2)
Alkaline Phosphatase: 64 U/L (ref 39–117)
Bilirubin, Direct: 0.1 mg/dL (ref 0.0–0.3)
Total Bilirubin: 0.8 mg/dL (ref 0.2–1.2)
Total Protein: 7 g/dL (ref 6.0–8.3)

## 2024-09-23 ENCOUNTER — Ambulatory Visit: Admitting: Family Medicine

## 2024-09-23 ENCOUNTER — Ambulatory Visit: Payer: Self-pay | Admitting: Family Medicine

## 2024-09-23 ENCOUNTER — Encounter: Payer: Self-pay | Admitting: Family Medicine

## 2024-09-23 VITALS — BP 118/64 | HR 78 | Temp 98.6°F | Ht 68.25 in | Wt 209.0 lb

## 2024-09-23 DIAGNOSIS — Z0000 Encounter for general adult medical examination without abnormal findings: Secondary | ICD-10-CM | POA: Diagnosis not present

## 2024-09-23 MED ORDER — MONTELUKAST SODIUM 10 MG PO TABS: ORAL_TABLET | ORAL | 3 refills | Status: AC

## 2024-09-23 MED ORDER — SIMVASTATIN 20 MG PO TABS: 20.0000 mg | ORAL_TABLET | Freq: Every day | ORAL | 3 refills | Status: AC

## 2024-09-23 NOTE — Progress Notes (Signed)
 Subjective:    Patient ID: Marcus Grant, male    DOB: [birth year], 68 y.o.   MRN: [id number]  HPI Here for a well exam. He feels well.    Review of Systems  Constitutional: Negative.   HENT: Negative.    Eyes: Negative.   Respiratory: Negative.    Cardiovascular: Negative.   Gastrointestinal: Negative.   Genitourinary: Negative.   Musculoskeletal: Negative.   Skin: Negative.   Neurological: Negative.   Psychiatric/Behavioral: Negative.         Objective:   Physical Exam Constitutional:      General: He is not in acute distress.    Appearance: Normal appearance. He is well-developed. He is not diaphoretic.  HENT:     Head: Normocephalic and atraumatic.     Right Ear: External ear normal.     Left Ear: External ear normal.     Nose: Nose normal.     Mouth/Throat:     Pharynx: No oropharyngeal exudate.  Eyes:     General: No scleral icterus.       Right eye: No discharge.        Left eye: No discharge.     Conjunctiva/sclera: Conjunctivae normal.     Pupils: Pupils are equal, round, and reactive to light.  Neck:     Thyroid : No thyromegaly.     Vascular: No JVD.     Trachea: No tracheal deviation.  Cardiovascular:     Rate and Rhythm: Normal rate and regular rhythm.     Pulses: Normal pulses.     Heart sounds: Normal heart sounds. No murmur heard.    No friction rub. No gallop.  Pulmonary:     Effort: Pulmonary effort is normal. No respiratory distress.     Breath sounds: Normal breath sounds. No wheezing or rales.  Chest:     Chest wall: No tenderness.  Abdominal:     General: Bowel sounds are normal. There is no distension.     Palpations: Abdomen is soft. There is no mass.     Tenderness: There is no abdominal tenderness. There is no guarding or rebound.  Genitourinary:    Penis: Normal. No tenderness.      Testes: Normal.     Prostate: Normal.     Rectum: Normal. Guaiac result negative.  Musculoskeletal:        General: No tenderness. Normal  range of motion.     Cervical back: Neck supple.  Lymphadenopathy:     Cervical: No cervical adenopathy.  Skin:    General: Skin is warm and dry.     Coloration: Skin is not pale.     Findings: No erythema or rash.  Neurological:     General: No focal deficit present.     Mental Status: He is alert and oriented to person, place, and time.     Cranial Nerves: No cranial nerve deficit.     Motor: No abnormal muscle tone.     Coordination: Coordination normal.     Deep Tendon Reflexes: Reflexes are normal and symmetric. Reflexes normal.  Psychiatric:        Mood and Affect: Mood normal.        Behavior: Behavior normal.        Thought Content: Thought content normal.        Judgment: Judgment normal.           Assessment & Plan:  Well exam. We discussed diet and exercise. Get fasting labs. Garnette Olmsted,  MD
